# Patient Record
Sex: Female | Born: 1985 | Hispanic: No | Marital: Single | Smoking: Never smoker
Health system: Southern US, Community
[De-identification: ages and names within clinical notes are randomized; demographics above are authoritative.]

## PROBLEM LIST (undated history)

## (undated) DIAGNOSIS — G4730 Sleep apnea, unspecified: Secondary | ICD-10-CM

## (undated) DIAGNOSIS — K219 Gastro-esophageal reflux disease without esophagitis: Secondary | ICD-10-CM

## (undated) DIAGNOSIS — E538 Deficiency of other specified B group vitamins: Secondary | ICD-10-CM

## (undated) DIAGNOSIS — G7000 Myasthenia gravis without (acute) exacerbation: Secondary | ICD-10-CM

## (undated) DIAGNOSIS — F32A Depression, unspecified: Secondary | ICD-10-CM

## (undated) DIAGNOSIS — I519 Heart disease, unspecified: Secondary | ICD-10-CM

## (undated) DIAGNOSIS — S0300XA Dislocation of jaw, unspecified side, initial encounter: Secondary | ICD-10-CM

## (undated) DIAGNOSIS — M359 Systemic involvement of connective tissue, unspecified: Secondary | ICD-10-CM

## (undated) DIAGNOSIS — G43909 Migraine, unspecified, not intractable, without status migrainosus: Secondary | ICD-10-CM

## (undated) DIAGNOSIS — J9600 Acute respiratory failure, unspecified whether with hypoxia or hypercapnia: Secondary | ICD-10-CM

## (undated) DIAGNOSIS — T7840XA Allergy, unspecified, initial encounter: Secondary | ICD-10-CM

## (undated) DIAGNOSIS — R7303 Prediabetes: Secondary | ICD-10-CM

## (undated) DIAGNOSIS — M797 Fibromyalgia: Secondary | ICD-10-CM

## (undated) DIAGNOSIS — I10 Essential (primary) hypertension: Secondary | ICD-10-CM

## (undated) DIAGNOSIS — R12 Heartburn: Secondary | ICD-10-CM

## (undated) DIAGNOSIS — F329 Major depressive disorder, single episode, unspecified: Secondary | ICD-10-CM

## (undated) DIAGNOSIS — G935 Compression of brain: Secondary | ICD-10-CM

## (undated) DIAGNOSIS — J309 Allergic rhinitis, unspecified: Secondary | ICD-10-CM

## (undated) DIAGNOSIS — I509 Heart failure, unspecified: Secondary | ICD-10-CM

## (undated) DIAGNOSIS — I272 Pulmonary hypertension, unspecified: Secondary | ICD-10-CM

## (undated) DIAGNOSIS — Z91018 Allergy to other foods: Secondary | ICD-10-CM

## (undated) DIAGNOSIS — J45909 Unspecified asthma, uncomplicated: Secondary | ICD-10-CM

## (undated) HISTORY — DX: Sleep apnea, unspecified: G47.30

## (undated) HISTORY — DX: Essential (primary) hypertension: I10

## (undated) HISTORY — DX: Pulmonary hypertension, unspecified: I27.20

## (undated) HISTORY — DX: Depression, unspecified: F32.A

## (undated) HISTORY — DX: Migraine, unspecified, not intractable, without status migrainosus: G43.909

## (undated) HISTORY — DX: Heart failure, unspecified: I50.9

## (undated) HISTORY — DX: Allergy, unspecified, initial encounter: T78.40XA

## (undated) HISTORY — DX: Unspecified asthma, uncomplicated: J45.909

## (undated) HISTORY — DX: Heart disease, unspecified: I51.9

## (undated) HISTORY — DX: Gastro-esophageal reflux disease without esophagitis: K21.9

## (undated) HISTORY — DX: Compression of brain: G93.5

## (undated) HISTORY — DX: Prediabetes: R73.03

## (undated) HISTORY — DX: Allergic rhinitis, unspecified: J30.9

## (undated) HISTORY — DX: Fibromyalgia: M79.7

## (undated) HISTORY — DX: Dislocation of jaw, unspecified side, initial encounter: S03.00XA

## (undated) HISTORY — DX: Allergy to other foods: Z91.018

## (undated) HISTORY — DX: Myasthenia gravis without (acute) exacerbation: G70.00

## (undated) HISTORY — PX: CARDIAC CATHETERIZATION: SHX172

## (undated) HISTORY — DX: Systemic involvement of connective tissue, unspecified: M35.9

## (undated) HISTORY — PX: THYMECTOMY: SHX1063

## (undated) HISTORY — DX: Heartburn: R12

## (undated) HISTORY — DX: Deficiency of other specified B group vitamins: E53.8

## (undated) HISTORY — DX: Acute respiratory failure, unspecified whether with hypoxia or hypercapnia: J96.00

---
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
## 2007-04-10 HISTORY — PX: THYMECTOMY: SHX1063

## 2007-04-23 ENCOUNTER — Ambulatory Visit (HOSPITAL_COMMUNITY): Admission: RE | Admit: 2007-04-23 | Discharge: 2007-04-23 | Payer: Self-pay | Admitting: Gastroenterology

## 2007-06-25 ENCOUNTER — Encounter: Admission: RE | Admit: 2007-06-25 | Discharge: 2007-06-25 | Payer: Self-pay | Admitting: Neurology

## 2008-08-23 ENCOUNTER — Ambulatory Visit: Payer: Self-pay | Admitting: Gynecology

## 2008-08-23 ENCOUNTER — Encounter: Payer: Self-pay | Admitting: Gynecology

## 2008-08-23 ENCOUNTER — Other Ambulatory Visit: Admission: RE | Admit: 2008-08-23 | Discharge: 2008-08-23 | Payer: Self-pay | Admitting: Gynecology

## 2008-11-07 ENCOUNTER — Emergency Department (HOSPITAL_COMMUNITY): Admission: EM | Admit: 2008-11-07 | Discharge: 2008-11-07 | Payer: Self-pay | Admitting: Emergency Medicine

## 2010-07-16 LAB — DIFFERENTIAL
Lymphs Abs: 2.8 10*3/uL (ref 0.7–4.0)
Monocytes Relative: 7 % (ref 3–12)
Neutro Abs: 5.8 10*3/uL (ref 1.7–7.7)
Neutrophils Relative %: 60 % (ref 43–77)

## 2010-07-16 LAB — BASIC METABOLIC PANEL
CO2: 30 mEq/L (ref 19–32)
Calcium: 9.2 mg/dL (ref 8.4–10.5)
Chloride: 102 mEq/L (ref 96–112)
GFR calc Af Amer: >60 mL/min (ref >60)
Sodium: 139 mEq/L (ref 135–145)

## 2010-07-16 LAB — URINALYSIS, ROUTINE W REFLEX MICROSCOPIC
Nitrite: NEGATIVE
Specific Gravity, Urine: 1.011 (ref 1.005–1.030)
pH: 6.5 (ref 5.0–8.0)

## 2010-07-16 LAB — CBC
RBC: 4.31 MIL/uL (ref 3.87–5.11)
WBC: 9.7 10*3/uL (ref 4.0–10.5)

## 2010-07-16 LAB — MONONUCLEOSIS SCREEN: Mono Screen: NEGATIVE

## 2010-07-16 LAB — RAPID STREP SCREEN (MED CTR MEBANE ONLY): Streptococcus, Group A Screen (Direct): NEGATIVE

## 2010-07-16 LAB — POCT PREGNANCY, URINE: Preg Test, Ur: NEGATIVE

## 2010-07-16 LAB — CK: Total CK: 70 U/L (ref 7–177)

## 2011-06-19 DIAGNOSIS — G7000 Myasthenia gravis without (acute) exacerbation: Secondary | ICD-10-CM | POA: Insufficient documentation

## 2012-08-06 ENCOUNTER — Encounter: Payer: Self-pay | Admitting: Gynecology

## 2012-08-06 ENCOUNTER — Ambulatory Visit (INDEPENDENT_AMBULATORY_CARE_PROVIDER_SITE_OTHER): Payer: BC Managed Care – PPO | Admitting: Gynecology

## 2012-08-06 VITALS — BP 118/74 | Ht 66.0 in | Wt 180.0 lb

## 2012-08-06 DIAGNOSIS — N9089 Other specified noninflammatory disorders of vulva and perineum: Secondary | ICD-10-CM

## 2012-08-06 DIAGNOSIS — N760 Acute vaginitis: Secondary | ICD-10-CM

## 2012-08-06 DIAGNOSIS — A499 Bacterial infection, unspecified: Secondary | ICD-10-CM

## 2012-08-06 DIAGNOSIS — B9689 Other specified bacterial agents as the cause of diseases classified elsewhere: Secondary | ICD-10-CM

## 2012-08-06 LAB — URINALYSIS W MICROSCOPIC + REFLEX CULTURE
Glucose, UA: NEGATIVE mg/dL
Hgb urine dipstick: NEGATIVE
Ketones, ur: NEGATIVE mg/dL
Leukocytes, UA: NEGATIVE
Protein, ur: NEGATIVE mg/dL

## 2012-08-06 LAB — WET PREP FOR TRICH, YEAST, CLUE
Trich, Wet Prep: NONE SEEN
Yeast Wet Prep HPF POC: NONE SEEN

## 2012-08-06 MED ORDER — METRONIDAZOLE 500 MG PO TABS: 500.0000 mg | ORAL_TABLET | Freq: Two times a day (BID) | ORAL | Status: DC

## 2012-08-06 NOTE — Progress Notes (Signed)
Patient presents with one-week history of vaginal irritation and slight discharge. Has been wearing tight underclothes but no other precipitating event. No odor. No urinary symptoms. Has recently increased her prednisone dose.  Exam with Selena Batten Assistant Abdomen soft nontender without masses guarding rebound organomegaly Pelvic external BUS vagina with white discharge. Cervix normal. Uterus normal size midline mobile nontender. Adnexa without masses or tenderness.   Assessment and plan: history, exam and wet prep consistent with bacterial vaginosis. Treat with Flagyl 500 mg twice a day x7 days at her choice, alcohol avoidance reviewed. Followup if symptoms persist, worsen or recur. Patient will schedule annual exam as she is overdue in May.

## 2012-08-06 NOTE — Patient Instructions (Signed)
Take Flagyl medication twice daily for 7 days, avoid alcohol while taking. Followup if your symptoms persist, worsen or recur. Followup for your annual exam as scheduled.

## 2012-08-20 ENCOUNTER — Encounter: Payer: Self-pay | Admitting: Gynecology

## 2012-09-02 ENCOUNTER — Encounter: Payer: Self-pay | Admitting: Gynecology

## 2012-09-09 ENCOUNTER — Encounter: Payer: Self-pay | Admitting: Gynecology

## 2012-11-13 ENCOUNTER — Other Ambulatory Visit (HOSPITAL_COMMUNITY)
Admission: RE | Admit: 2012-11-13 | Discharge: 2012-11-13 | Disposition: A | Discharge status: Home / Self Care | Payer: BC Managed Care – PPO | Source: Ambulatory Visit | Attending: Gynecology | Admitting: Gynecology

## 2012-11-13 ENCOUNTER — Encounter: Payer: Self-pay | Admitting: Gynecology

## 2012-11-13 ENCOUNTER — Ambulatory Visit (INDEPENDENT_AMBULATORY_CARE_PROVIDER_SITE_OTHER): Payer: BC Managed Care – PPO | Admitting: Gynecology

## 2012-11-13 VITALS — BP 120/78 | Ht 66.5 in | Wt 172.0 lb

## 2012-11-13 DIAGNOSIS — Z01419 Encounter for gynecological examination (general) (routine) without abnormal findings: Secondary | ICD-10-CM

## 2012-11-13 DIAGNOSIS — N760 Acute vaginitis: Secondary | ICD-10-CM

## 2012-11-13 DIAGNOSIS — B9689 Other specified bacterial agents as the cause of diseases classified elsewhere: Secondary | ICD-10-CM

## 2012-11-13 DIAGNOSIS — A499 Bacterial infection, unspecified: Secondary | ICD-10-CM

## 2012-11-13 LAB — WET PREP FOR TRICH, YEAST, CLUE

## 2012-11-13 MED ORDER — METRONIDAZOLE 500 MG PO TABS: 500.0000 mg | ORAL_TABLET | Freq: Two times a day (BID) | ORAL | Status: DC

## 2012-11-13 NOTE — Patient Instructions (Signed)
Follow up for annual exam in one year 

## 2012-11-13 NOTE — Addendum Note (Signed)
Addended by: Dayna Barker on: 11/13/2012 04:17 PM   Modules accepted: Orders

## 2012-11-13 NOTE — Progress Notes (Signed)
Sandy Baker 21-May-1985 [id number]        27 y.o.  G0P0 for annual exam.  Doing well without complaints.  Past medical history,surgical history, medications, allergies, family history and social history were all reviewed and documented in the EPIC chart.  ROS:  Performed and pertinent positives and negatives are included in the history, assessment and plan .  Exam: [name] Filed Vitals:   11/13/12 1544  BP: 120/78  Height: 5' 6.5" (1.689 m)  Weight: 172 lb (78.019 kg)   General appearance  Normal Skin grossly normal Head/Neck normal with no cervical or supraclavicular adenopathy thyroid normal Lungs  clear Cardiac RR, without RMG Abdominal  soft, nontender, without masses, organomegaly or hernia Breasts  examined lying and sitting without masses, retractions, discharge or axillary adenopathy. Pelvic  Ext/BUS/vagina  normal with white discharge  Cervix  normal Pap done  Uterus  anteverted, normal size, shape and contour, midline and mobile nontender   Adnexa  Without masses or tenderness    Anus and perineum  normal       Assessment/Plan:  27 y.o. G0P0 female for annual exam, regular menses, condom contraception.   1. Birth control. I reviewed birth control options with her. She is occasionally sexually active. Reviewed still risk for pregnancy and failure risk with condoms. Patient is not interested in alternatives and understands accepts risk. Availability of Plan B also discussed. 2. White discharge.  Patient is asymptomatic but her wet prep does suggest early bacterial vaginosis. She was treated in April for symptomatic bacterial vaginosis. Going cover her with Flagyl 500 mg twice a day x7 days alcohol avoidance reviewed. 3. STD screening offered and declined. 4. Gardasil series received. 5. Breast self. SBE monthly reviewed. 6. Pap smear done today. No history of abnormal Pap smears previously. If normal plan three-year repeat. 7. Health maintenance. No lab work  done as it is all done through her other physician's office. Followup one year, sooner as needed.  Note: This document was prepared with digital dictation and possible smart phrase technology. Any transcriptional errors that result from this process are unintentional.   [name], 4:08 PM 11/13/2012

## 2012-11-13 NOTE — Addendum Note (Signed)
Addended by: Dayna Barker on: 11/13/2012 04:36 PM   Modules accepted: Orders

## 2012-11-17 ENCOUNTER — Other Ambulatory Visit: Payer: Self-pay | Admitting: Gynecology

## 2012-11-18 ENCOUNTER — Other Ambulatory Visit: Payer: Self-pay | Admitting: Gynecology

## 2012-11-18 MED ORDER — FLUCONAZOLE 150 MG PO TABS: 150.0000 mg | ORAL_TABLET | Freq: Once | ORAL | Status: DC

## 2013-09-09 DIAGNOSIS — J45909 Unspecified asthma, uncomplicated: Secondary | ICD-10-CM | POA: Insufficient documentation

## 2013-09-09 DIAGNOSIS — J309 Allergic rhinitis, unspecified: Secondary | ICD-10-CM | POA: Insufficient documentation

## 2013-09-09 DIAGNOSIS — M797 Fibromyalgia: Secondary | ICD-10-CM | POA: Insufficient documentation

## 2013-09-12 DIAGNOSIS — K219 Gastro-esophageal reflux disease without esophagitis: Secondary | ICD-10-CM | POA: Insufficient documentation

## 2014-07-19 ENCOUNTER — Ambulatory Visit (INDEPENDENT_AMBULATORY_CARE_PROVIDER_SITE_OTHER): Payer: 59 | Admitting: Family Medicine

## 2014-07-19 VITALS — BP 104/76 | HR 111 | Temp 98.2°F | Resp 20 | Ht 67.0 in | Wt 194.0 lb

## 2014-07-19 DIAGNOSIS — J302 Other seasonal allergic rhinitis: Secondary | ICD-10-CM | POA: Diagnosis not present

## 2014-07-19 DIAGNOSIS — M797 Fibromyalgia: Secondary | ICD-10-CM

## 2014-07-19 DIAGNOSIS — G43809 Other migraine, not intractable, without status migrainosus: Secondary | ICD-10-CM

## 2014-07-19 DIAGNOSIS — G4730 Sleep apnea, unspecified: Secondary | ICD-10-CM | POA: Diagnosis not present

## 2014-07-19 DIAGNOSIS — G7000 Myasthenia gravis without (acute) exacerbation: Secondary | ICD-10-CM

## 2014-07-19 NOTE — Progress Notes (Addendum)
Subjective:    Patient ID: Sandy Baker, female    DOB: 06/04/1985, 29 y.o.   MRN: [id number]  HPI Chief Complaint  Patient presents with  . Breathing Problem    while sleeping  . Referral    migraines and fibromyalgia   This chart was scribed for Norberto Sorenson, MD by Andrew Au, ED Scribe. This patient was seen in room 13 and the patient's care was started at 5:52 PM.  HPI Comments: Sandy Baker is a 29 y.o. female who presents to the [facility] complaining of trouble breathing while sleeping that began 1 month ago. Pt states she quits breathing and chokes while sleeping and is constantly waking during the night. Pt is unsure if symptoms occur every night but reports symptoms occurred 4 times last night and usually occur when drifting to sleep.  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 Pt states she is seen by neurologist Dr. Orson Gear at [facility] and has not been able to get an appointment and no return call after repeatedly trying 1-2 weeks ago.  Pt was told that apnea can often by connected to MG and that she needs a sleep study but that she has not been contacted to schedule one and that she is getting VERY fatigued as she is terrified to go to sleep - she worries that she is going to choke and die in her sleep - which no one has been able to reassure her will not happen due to her complication of MG.  She has been trying to sleep on her side supported by pillows to help her throat and soft palate fall open but not much success as she moves to much during sleep.  Pt states she has been told during allergy season symptoms flare. She reports postnasal drip. Pt states she uses a neb treatment at home in the morning and at night to help with allergies.  Pt is currently taking Singulair, Xyzal  and tessalon.   She denies congestion.   Pt is requesting a referral to Dr. Orson Gear, phone number [phone], Joycie Peek at the pain clinic, and [facility] rheumatologist at internal med.   Pt would like to establish here for a PCP.  Her insurance changed and so pt needs new referrals from a PCP to her current team of specialists at [facility] for the visits to be covered.  Aanvi's sister, Essence, is applying to medical school and frequently works with [place] as a Neurosurgeon.  Past Medical History  Diagnosis Date  . Autoimmune disease   . Asthma   . Myasthenia gravis   . Migraines   . TMJ (dislocation of temporomandibular joint)    Past Surgical History  Procedure Laterality Date  . Thymectomy     Prior to Admission medications   Medication Sig Start Date End Date Taking? Authorizing Provider  ALBUTEROL IN Inhale into the lungs.   Yes Historical Provider, MD  butalbital-aspirin-caffeine [facility]) 50-325-40 MG per tablet Take 1 tablet by mouth 2 (two) times daily as needed for headache.   Yes Historical Provider, MD  fluconazole (DIFLUCAN) 150 MG tablet Take 1 tablet (150 mg total) by mouth once. 11/18/12  Yes Dara Lords, MD  metroNIDAZOLE (FLAGYL) 500 MG tablet Take 1 tablet (500 mg total) by mouth  2 (two) times daily. For 7 days.  Avoid alcohol while taking 11/13/12  Yes Dara Lords, MD  mycophenolate (CELLCEPT) 500 MG tablet Take by mouth 2 (two) times daily.   Yes Historical Provider, MD  nortriptyline (PAMELOR) 10 MG capsule Take 10 mg by mouth at bedtime.   Yes Historical Provider, MD  predniSONE (DELTASONE) 10 MG tablet Take 10 mg by mouth daily.   Yes Historical Provider, MD  Topiramate (TOPAMAX PO) Take by mouth.   Yes Historical Provider, MD    Review of Systems  Constitutional: Positive for activity change, fatigue and unexpected weight change. Negative for fever, chills and appetite change.  HENT: Positive for congestion, postnasal drip and rhinorrhea. Negative for ear  pain, facial swelling, mouth sores, sore throat and trouble swallowing.   Respiratory: Positive for apnea and choking. Negative for cough and wheezing.   Cardiovascular: Negative for chest pain and palpitations.  Gastrointestinal: Negative for vomiting.  Musculoskeletal: Positive for arthralgias and gait problem. Negative for myalgias, back pain and joint swelling.  Skin: Negative for color change and rash.  Neurological: Positive for speech difficulty, weakness and headaches. Negative for syncope.  Psychiatric/Behavioral: Positive for sleep disturbance. Negative for dysphoric mood. The patient is nervous/anxious.     Objective:   Physical Exam  Constitutional: She is oriented to person, place, and time. She appears well-developed and well-nourished. No distress.  HENT:  Head: Normocephalic and atraumatic.  Right Ear: Tympanic membrane, external ear and ear canal normal.  Left Ear: Tympanic membrane, external ear and ear canal normal.  Nose: Mucosal edema present.  Mouth/Throat: Posterior oropharyngeal edema and posterior oropharyngeal erythema present.  Eyes: Conjunctivae and EOM are normal. No scleral icterus.  Neck: Normal range of motion. Neck supple. No thyromegaly present.  Cardiovascular: Normal rate, regular rhythm, normal heart sounds and intact distal pulses.   Pulmonary/Chest: Effort normal and breath sounds normal. No respiratory distress.  Musculoskeletal: Normal range of motion. She exhibits no edema.  Lymphadenopathy:    She has no cervical adenopathy.  Neurological: She is alert and oriented to person, place, and time.  Skin: Skin is warm and dry. She is not diaphoretic. No erythema.  Psychiatric: She has a normal mood and affect. Her behavior is normal.  Nursing note and vitals reviewed.  Filed Vitals:   07/19/14 1731  BP: 104/76  Pulse: 111  Temp: 98.2 F (36.8 C)  Resp: 20    Assessment & Plan:   Sleep apnea - Plan: Ambulatory referral to Sleep Studies -  needs sleep study asap - unfortunately it is taking [facility] sev mos to work new pts in just to the clinic for consultation which is time that pt cannot afford - I am  Hoping that since she was seen there in 2009 and since this is clearly such an atypical and complicated case with obvious concerns for much higher acuity that they can work pt in sooner or at least give pt (and me) some guidance as to natural history and progression of apneic episodes/hypoventilation in MG pts.   If [facility] is unable to see pt soon will need to request for [facility], [facility], or other - at least need neurology eval and comment on the acuity of this.  Myasthenia gravis - Plan: Ambulatory referral to Sleep Studies, Ambulatory referral to Neurology, Ambulatory referral to Pain Clinic, Ambulatory referral to Rheumatology  Seasonal allergies - cont singulair, xyzal, and prn tessalon  Ocular myasthenia gravis - Plan: Ambulatory referral to Sleep Studies,  Ambulatory referral to Neurology, Ambulatory referral to Pain Clinic, Ambulatory referral to Rheumatology  Other migraine without status migrainosus, not intractable - Plan: Ambulatory referral to Neurology, Ambulatory referral to Pain Clinic  Fibromyalgia - Plan: Ambulatory referral to Pain Clinic, Ambulatory referral to Rheumatology All referrals to [facility] need to be updated for insurance purposes.   Meds ordered this encounter  Medications  . butalbital-aspirin-caffeine (FIORINAL) 50-325-40 MG per tablet    Sig: Take 1 tablet by mouth 2 (two) times daily as needed for headache.    I personally performed the services described in this documentation, which was scribed in my presence. The recorded information has been reviewed and considered, and addended by me as needed.  Norberto SorensonEva Kataya Guimont, MD MPH

## 2014-08-27 ENCOUNTER — Encounter: Payer: Self-pay | Admitting: Neurology

## 2014-08-27 ENCOUNTER — Ambulatory Visit (INDEPENDENT_AMBULATORY_CARE_PROVIDER_SITE_OTHER): Payer: 59 | Admitting: Neurology

## 2014-08-27 VITALS — BP 128/80 | HR 92 | Resp 16 | Ht 67.0 in | Wt 198.0 lb

## 2014-08-27 DIAGNOSIS — G7000 Myasthenia gravis without (acute) exacerbation: Secondary | ICD-10-CM | POA: Diagnosis not present

## 2014-08-27 DIAGNOSIS — R519 Headache, unspecified: Secondary | ICD-10-CM

## 2014-08-27 DIAGNOSIS — G2581 Restless legs syndrome: Secondary | ICD-10-CM | POA: Diagnosis not present

## 2014-08-27 DIAGNOSIS — R51 Headache: Secondary | ICD-10-CM | POA: Diagnosis not present

## 2014-08-27 DIAGNOSIS — G4710 Hypersomnia, unspecified: Secondary | ICD-10-CM | POA: Diagnosis not present

## 2014-08-27 DIAGNOSIS — G478 Other sleep disorders: Secondary | ICD-10-CM

## 2014-08-27 DIAGNOSIS — R0683 Snoring: Secondary | ICD-10-CM | POA: Diagnosis not present

## 2014-08-27 DIAGNOSIS — R400 Somnolence: Secondary | ICD-10-CM

## 2014-08-27 NOTE — Patient Instructions (Signed)

## 2014-08-27 NOTE — Progress Notes (Signed)
Subjective:    Patient ID: Sandy Baker is a 29 y.o. female.  HPI      Dear Dr. Clelia CroftShaw,   I saw your patient, Sandy Baker, upon your kind request in my neurologic clinic today for initial consultation of her sleep disorder, in particular, concern for underlying obstructive sleep apnea. The patient is unaccompanied today. As you know, Ms. Sandy Baker is a 29 year old right-handed woman with an underlying medical history of asthma, migraines, ocular myasthenia (diagnosed in 2009, status post thymectomy in 2009), for which [facility] is [facility], who reports snoring and excessive daytime somnolence. She has woken herself up with a gasp or panic or startle at times. She has occasional morning headaches. She has had trouble with going to sleep and staying asleep. Her gasping issues at night started about 6 months ago. There is a family history of insomnia in her mother, and maternal aunts, her sister has essential tremor and a maternal uncle has ET too.  She has occasional RLS symptoms, but is not sure if she kicks in her sleep. She lives with her parents and sleeps alone, snoring is mild and not every night as far as she knows.  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 She would like to go to PA school. She has no nocturia. She may take 0-3 hours to fall asleep. Her Epworth sleepiness score is 6 out of 24, her fatigue score is 42 out of 63 today. She drinks Dr. Reino KentPepper, 1-2 per week, no caffeine daily. She is a non-smoker and does not drink alcohol. Her main complaint regarding her sleep is nonrestorative sleep, difficulty initiating and maintaining sleep, and waking up with a sense of gasping for air. She has [facility] by [facility] to [facility] a sleep study because of her underlying  neuromuscular disease.  Her Past Medical History Is Significant For: Past Medical History  Diagnosis Date  . Autoimmune disease   . Asthma   . Myasthenia gravis   . Migraines   . TMJ (dislocation of temporomandibular joint)   . Fibromyalgia     Her Past Surgical History Is Significant For: Past Surgical History  Procedure Laterality Date  . Thymectomy      Her Family History Is Significant For: Family History  Problem Relation Age of Onset  . Hypertension Mother   . Depression Mother   . Asthma Mother   . Bipolar disorder Mother   . Asthma Sister     Her Social History Is Significant For: History   Social History  . Marital Status: Single    Spouse Name: Sandy Baker  . Number of Children: Sandy Baker  . Years of Education: BA   Occupational History  . [employer]    Social History Main Topics  . Smoking status: Never Smoker   . Smokeless tobacco: Not on file  . Alcohol Use: No     Comment: Rare  . Drug Use: No  . Sexual Activity: Yes    Birth Control/ Protection: Condom   Other Topics Concern  . 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 Reino KentPepper    Her Allergies Are:  Allergies  Allergen Reactions  . Amoxicillin   .  Botox [Botulinum Toxin Type A]   . Iodine   . Latex   . Penicillins   . Quinine Derivatives   . Relpax [Eletriptan]   . Saline   . Zithromax [Azithromycin]   :   Her Current Medications Are:  Outpatient Encounter Prescriptions as of 08/27/2014  Medication Sig  . ALBUTEROL IN Inhale into the lungs.  . benzonatate (TESSALON) 100 MG capsule Take 100 mg by mouth 3 (three) times daily as needed.  . butalbital-aspirin-caffeine (FIORINAL) 50-325-40 MG per tablet Take 1 tablet by mouth 2 (two) times daily as needed for headache.  . levocetirizine (XYZAL) 5 MG tablet   . montelukast (SINGULAIR) 10 MG tablet   . mycophenolate (CELLCEPT) 250 MG capsule   . nortriptyline (PAMELOR) 10 MG capsule Take 10 mg by mouth at  bedtime.  . predniSONE (DELTASONE) 10 MG tablet Take 10 mg by mouth daily.  . Topiramate (TOPAMAX PO) Take by mouth.  . topiramate (TOPAMAX) 50 MG tablet   . [DISCONTINUED] fluconazole (DIFLUCAN) 150 MG tablet Take 1 tablet (150 mg total) by mouth once.  . [DISCONTINUED] metroNIDAZOLE (FLAGYL) 500 MG tablet Take 1 tablet (500 mg total) by mouth 2 (two) times daily. For 7 days.  Avoid alcohol while taking  . [DISCONTINUED] mycophenolate (CELLCEPT) 500 MG tablet Take by mouth 2 (two) times daily.   No facility-administered encounter medications on file as of 08/27/2014.  :  Review of Systems:  Out of a complete 14 point review of systems, all are reviewed and negative with the exception of these symptoms as listed below:   Review of Systems  Constitutional:       Weight gain   Eyes: Positive for pain.  Cardiovascular: Positive for leg swelling.  Neurological:       Insomnia    Objective:  Neurologic Exam  Physical Exam Physical Examination:   Filed Vitals:   08/27/14 0844  BP: 128/80  Pulse: 92  Resp: 16    General Examination: The patient is a very pleasant 29 y.o. female in no acute distress. She appears well-developed and well-nourished and well groomed.   HEENT: Normocephalic, atraumatic, pupils are equal, round and reactive to light and accommodation. Funduscopic exam is normal with sharp disc margins noted. Extraocular tracking is good without limitation to gaze excursion or nystagmus noted. Normal smooth pursuit is noted. Hearing is grossly intact. Tympanic membranes are clear bilaterally. Face is symmetric with  very subtle facial weakness noted and mild bilateral ptosis noted. Speech is clear but slightly nasal with no dysarthria noted. There is no hypophonia. There is no lip, neck/head, jaw or voice tremor. Neck is supple with full range of passive and active motion. There are no carotid bruits on auscultation. Oropharynx exam reveals: mild mouth dryness, good dental  hygiene and mild airway crowding, due to narrow airway entry, larger uvula and elongated tongue. Mallampati is class II. Tongue protrudes centrally and palate elevates symmetrically. Tonsils are small. Neck size is 14.25 inches. She has a Mild overbite. Nasal inspection reveals no significant nasal mucosal bogginess or redness and no septal deviation.   Chest: Clear to auscultation without wheezing, rhonchi or crackles noted. She has a unremarkable midsternal scar from her thymectomy in 2009 (mild keloid formation).  Heart: S1+S2+0, regular and normal without murmurs, rubs or gallops noted.   Abdomen: Soft, non-tender and non-distended with normal bowel sounds appreciated on auscultation.  Extremities: There is no pitting edema in the distal lower extremities bilaterally. Pedal pulses are intact.  Skin: Warm and dry without trophic changes noted. There are no varicose veins.  Musculoskeletal: exam reveals no obvious joint deformities, tenderness or joint swelling or erythema.   Neurologically:  Mental status: The patient is awake, alert and oriented in all 4 spheres. Her immediate and remote memory, attention, language skills and fund of knowledge are appropriate. There is no evidence of aphasia, agnosia, apraxia or anomia. Speech is clear with normal prosody and enunciation. Thought process is linear. Mood is normal and affect is normal.  Cranial nerves II - XII are as described above under HEENT exam. In addition: shoulder shrug is normal with equal shoulder height noted. Motor exam: Normal bulk, strength and tone is noted. There is no drift, tremor or rebound. Romberg is negative. Reflexes are 2+ throughout. Babinski: Toes are flexor bilaterally. Fine motor skills and coordination: intact with normal finger taps, normal hand movements, normal rapid alternating patting, normal foot taps and normal foot agility.  Cerebellar testing: No dysmetria or intention tremor on finger to nose testing. Heel  to shin is unremarkable bilaterally. There is no truncal or gait ataxia.  Sensory exam: intact to light touch, pinprick, vibration, temperature sense in the upper and lower extremities.  Gait, station and balance: She stands easily. No veering to one side is noted. No leaning to one side is noted. Posture is age-appropriate and stance is narrow based. Gait shows normal stride length and normal pace. No problems turning are noted. She turns en bloc. Tandem walk is unremarkable. Intact toe and heel stance is noted.               Assessment and Plan:   In summary, Sandy Baker is a very pleasant 29 y.o.-year old female with an underlying medical history of asthma, migraines, ocular myasthenia (diagnosed in 2009, status post thymectomy in 2009), for which [facility] is [facility], whose history and physical exam are somewhat concerning for obstructive sleep apnea (OSA). Having myasthenia gravis does put her in a higher risk group for sleep disordered breathing.  I had a long chat with the patient about my findings and the diagnosis of OSA, its prognosis and treatment options. We talked about medical treatments, surgical interventions and non-pharmacological approaches. I explained in particular the risks and ramifications of untreated moderate to severe OSA, especially with respect to developing cardiovascular disease down the Road, including congestive heart failure, difficult to treat hypertension, cardiac arrhythmias, or stroke. Even type 2 diabetes has, in part, been linked to untreated OSA. Symptoms of untreated OSA include daytime sleepiness, memory problems, mood irritability and mood disorder such as depression and anxiety, lack of energy, as well as recurrent headaches, especially morning headaches. We talked about trying to maintain a healthy lifestyle in general, as well as the importance of weight control. I encouraged the patient to eat healthy, exercise daily and keep well hydrated, to keep a  scheduled bedtime and wake time routine, to not skip any meals and eat healthy snacks in between meals. I advised the patient not to drive when feeling sleepy. I recommended the following at this time: sleep study with potential positive airway pressure titration. (We will score hypopneas at 4% and split the sleep study into diagnostic and treatment portion, if the estimated. 2 hour AHI is >20/h).   I explained the sleep test procedure to the patient and also outlined possible surgical and non-surgical treatment options of OSA, including the use of a custom-made dental device (which would require a referral to a  specialist dentist or oral surgeon), upper airway surgical options, such as pillar implants, radiofrequency surgery, tongue base surgery, and UPPP (which would involve a referral to an ENT surgeon). Rarely, jaw surgery such as mandibular advancement may be considered. However, her MG does not make her a good surgical candidate in general. I also explained the CPAP treatment option to the patient, who indicated that she would be willing to try CPAP if the need arises. I explained the importance of being compliant with PAP treatment, not only for insurance purposes but primarily to improve Her symptoms, and for the patient's long term health benefit, including to reduce Her cardiovascular risks. I answered all her questions today and the patient was in agreement. I would like to see her back after the sleep study is completed and encouraged her to call with any interim questions, concerns, problems or updates.   Thank you very much for allowing me to participate in the care of this nice patient. If I can be of any further assistance to you please do not hesitate to call me at [phone].  Sincerely,   Huston Foley, MD, PhD

## 2014-09-17 ENCOUNTER — Telehealth: Payer: Self-pay | Admitting: Neurology

## 2014-09-17 DIAGNOSIS — R0683 Snoring: Secondary | ICD-10-CM

## 2014-09-17 DIAGNOSIS — G4719 Other hypersomnia: Secondary | ICD-10-CM

## 2014-09-17 DIAGNOSIS — G478 Other sleep disorders: Secondary | ICD-10-CM

## 2014-09-17 NOTE — Telephone Encounter (Signed)
[employer] contacted the office to inform that request for split study has been denied. For Peer to peer review to further process please call [phone]. Home sleep test has been advised.

## 2014-10-20 ENCOUNTER — Telehealth: Payer: Self-pay | Admitting: Neurology

## 2014-10-20 NOTE — Telephone Encounter (Signed)
PSG denied, need order for HST °

## 2014-10-28 ENCOUNTER — Telehealth: Payer: Self-pay | Admitting: Neurology

## 2014-10-28 NOTE — Telephone Encounter (Signed)
Pt called and says that she has an appt for a sleep study in [place], insurance will not pay for it. They will only pay for a home sleep study , she needs to know what to do. Her appointment is 7/22 at 9pm. Please call and advise [phone]

## 2014-11-05 DIAGNOSIS — G4733 Obstructive sleep apnea (adult) (pediatric): Secondary | ICD-10-CM | POA: Insufficient documentation

## 2014-11-10 ENCOUNTER — Telehealth: Payer: Self-pay | Admitting: Neurology

## 2014-11-10 NOTE — Telephone Encounter (Signed)
Dr. Frances Furbish stated that she has received the report and signed off on it. (It has not been scanned into the chart yet) Dr. Frances Furbish states that the report says that their doctors will treat her.

## 2014-11-10 NOTE — Telephone Encounter (Signed)
This patient's insurance denied an attended sleep study. I will order home sleep test.  

## 2014-11-10 NOTE — Telephone Encounter (Signed)
I called Ms. Riggins to see if she had a sleep study in the recent months and she reports the sleep study was completed at [facility].  She would like for Dr. Frances Furbish to get the results of this sleep study for her.

## 2014-12-22 ENCOUNTER — Ambulatory Visit (INDEPENDENT_AMBULATORY_CARE_PROVIDER_SITE_OTHER): Payer: 59 | Admitting: Family Medicine

## 2014-12-22 ENCOUNTER — Ambulatory Visit (INDEPENDENT_AMBULATORY_CARE_PROVIDER_SITE_OTHER): Payer: 59

## 2014-12-22 VITALS — BP 122/74 | HR 118 | Temp 98.8°F | Resp 20 | Ht 67.0 in | Wt 199.0 lb

## 2014-12-22 DIAGNOSIS — J4521 Mild intermittent asthma with (acute) exacerbation: Secondary | ICD-10-CM

## 2014-12-22 DIAGNOSIS — G7000 Myasthenia gravis without (acute) exacerbation: Secondary | ICD-10-CM

## 2014-12-22 DIAGNOSIS — R0602 Shortness of breath: Secondary | ICD-10-CM

## 2014-12-22 DIAGNOSIS — J0100 Acute maxillary sinusitis, unspecified: Secondary | ICD-10-CM | POA: Diagnosis not present

## 2014-12-22 DIAGNOSIS — D899 Disorder involving the immune mechanism, unspecified: Secondary | ICD-10-CM

## 2014-12-22 DIAGNOSIS — J301 Allergic rhinitis due to pollen: Secondary | ICD-10-CM | POA: Diagnosis not present

## 2014-12-22 DIAGNOSIS — R05 Cough: Secondary | ICD-10-CM | POA: Diagnosis not present

## 2014-12-22 DIAGNOSIS — D849 Immunodeficiency, unspecified: Secondary | ICD-10-CM

## 2014-12-22 LAB — POCT CBC
GRANULOCYTE PERCENT: 77.7 % (ref 37–80)
HEMATOCRIT: 43.6 % (ref 37.7–47.9)
HEMOGLOBIN: 12.9 g/dL (ref 12.2–16.2)
LYMPH, POC: 1.8 (ref 0.6–3.4)
MCH: 25.7 pg — AB (ref 27–31.2)
MCHC: 29.6 g/dL — AB (ref 31.8–35.4)
MCV: 87 fL (ref 80–97)
MID (cbc): 0.7 (ref 0–0.9)
MPV: 7.6 fL (ref 0–99.8)
POC GRANULOCYTE: 8.5 — AB (ref 2–6.9)
POC LYMPH PERCENT: 16.1 %L (ref 10–50)
POC MID %: 6.2 % (ref 0–12)
Platelet Count, POC: 327 10*3/uL (ref 142–424)
RBC: 5.01 M/uL (ref 4.04–5.48)
RDW, POC: 13.9 %
WBC: 10.9 10*3/uL — AB (ref 4.6–10.2)

## 2014-12-22 MED ORDER — MONTELUKAST SODIUM 10 MG PO TABS: 10.0000 mg | ORAL_TABLET | Freq: Every day | ORAL | Status: DC

## 2014-12-22 MED ORDER — BENZONATATE 100 MG PO CAPS
100.0000 mg | ORAL_CAPSULE | Freq: Three times a day (TID) | ORAL | Status: DC | PRN
Start: 2014-12-22 — End: 2016-03-27

## 2014-12-22 MED ORDER — LEVOCETIRIZINE DIHYDROCHLORIDE 5 MG PO TABS: 5.0000 mg | ORAL_TABLET | Freq: Every evening | ORAL | Status: DC

## 2014-12-22 MED ORDER — ALBUTEROL SULFATE (2.5 MG/3ML) 0.083% IN NEBU: 2.5000 mg | INHALATION_SOLUTION | Freq: Once | RESPIRATORY_TRACT | Status: DC

## 2014-12-22 MED ORDER — ALBUTEROL SULFATE (2.5 MG/3ML) 0.083% IN NEBU: 2.5000 mg | INHALATION_SOLUTION | Freq: Four times a day (QID) | RESPIRATORY_TRACT | Status: DC | PRN

## 2014-12-22 MED ORDER — TETRACYCLINE HCL 250 MG PO CAPS: 250.0000 mg | ORAL_CAPSULE | Freq: Four times a day (QID) | ORAL | Status: DC

## 2014-12-22 NOTE — Patient Instructions (Addendum)
1.  Purchase Flonase nasal spray and use 2 sprays in each nostril twice daily for one week and then once daily. 2.  Purchase Afrin nasal spray 2 sprays in each nostril twice daily for one week only and then STOP. 3. Take Prednisone  daily for 5 days then  daily for five days then  daily for five days then  daily afterwards.

## 2014-12-22 NOTE — Progress Notes (Addendum)
Subjective:  This chart was scribed for Nilda Simmer, MD by Andrew Au, ED Scribe. This patient was seen in room 9 and the patient's care was started at 7:52 PM.   Patient ID: Sandy Baker, female    DOB: Apr 12, 1985, 29 y.o.   MRN: [id number]  HPI Chief Complaint  Patient presents with  . Asthma    or allergies. Short of breath and cough. x1 week  . Medication Refill    albuterol   HPI Comments: Sandy Baker is a 29 y.o. Female with hx of myasthenia gravis who presents to the [facility] complaining of SOB and dry cough for the past week. She has associated teeth pain that began last night, HA, sinus pressure, sore throat, hoarseness, nasal congestion with thick green and yellow mucous and rhinorrhea. She has been taking tessalon perles and has been doing a nette rinse twice a day as well as taking allergy medications. She has hx of asthma, allergies,and myasthenia gravis. Pt states she ran out of albuterol nebulizer solution but has been using her pro air inhaler every 2-4 hours for the past 48 hours.   She usually uses nebulize once a week between the months of April- November which is when she has most asthma flares. Pt is also on BiPAP qhs. Takes prednisone  daily for myasthenia gravis and has been trying to wean off medication. Pt denies fever and chills.  Previous maintenance inhaler in the past; had an allergic reaction to it.  Has been using Proair inhaler every two hours for the past week.  Able to ambulate to work just fine; nasal congestion is horrible.    Goes to [facility] for neurology.  Has mus ocular MG.  S/p thymectomy.    There are no active problems to display for this patient.  Past Medical History  Diagnosis Date  . Autoimmune disease   . Asthma   . Myasthenia gravis   . Migraines   . TMJ (dislocation of temporomandibular joint)   . Fibromyalgia    Past Surgical History  Procedure Laterality Date  . Thymectomy     Allergies  Allergen  Reactions  . Amoxicillin   . Botox [Botulinum Toxin Type A]   . Iodine   . Latex   . Penicillins   . Quinine Derivatives   . Relpax [Eletriptan]   . Saline   . Zithromax [Azithromycin]     Prior to Admission medications   Medication Sig Start Date End Date Taking? Authorizing Provider  ALBUTEROL IN Inhale into the lungs.   Yes Historical Provider, MD  butalbital-aspirin-caffeine [facility]) 50-325-40 MG per tablet Take 1 tablet by mouth 2 (two) times daily as needed for headache.   Yes Historical Provider, MD  montelukast (SINGULAIR) 10 MG tablet  08/09/14  Yes Historical Provider, MD  mycophenolate (CELLCEPT) 250 MG capsule  08/19/14  Yes Historical Provider, MD  nortriptyline (PAMELOR) 10 MG capsule Take 10 mg by mouth at bedtime.   Yes Historical Provider, MD  predniSONE (DELTASONE) 10 MG tablet Take 10 mg by mouth daily.   Yes Historical Provider, MD  Topiramate (TOPAMAX PO) Take by mouth.   Yes Historical Provider, MD  topiramate (TOPAMAX) 50 MG tablet  08/09/14  Yes Historical Provider, MD  benzonatate (TESSALON) 100 MG capsule Take 100 mg by mouth 3 (three) times daily as needed. 08/09/14   Historical Provider, MD  levocetirizine Elita Boone) 5 MG tablet  08/14/14   Historical Provider, MD   Social  History   Social History  . Marital Status: Single    Spouse Name: N/A  . Number of Children: N/A  . Years of Education: BA   Occupational History  . [employer]    . [employer]    Social History Main Topics  . Smoking status: Never Smoker   . Smokeless tobacco: Not on file  . Alcohol Use: No     Comment: Rare  . Drug Use: No  . Sexual Activity: Yes    Birth Control/ Protection: Condom   Other Topics Concern  . 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 Hypertension Mother   . Depression Mother   . Asthma Mother   . Bipolar disorder Mother   . Asthma Sister     Review of Systems    Constitutional: Negative for fever, chills, diaphoresis and fatigue.  HENT: Positive for congestion, dental problem, postnasal drip, rhinorrhea, sinus pressure, sore throat and voice change. Negative for ear pain.   Respiratory: Positive for cough and shortness of breath. Negative for wheezing.   Cardiovascular: Negative for chest pain and leg swelling.  Gastrointestinal: Negative for nausea, vomiting, abdominal pain and diarrhea.  Genitourinary: Negative for enuresis.  Skin: Negative for rash.  Neurological: Positive for headaches.       Objective:   Physical Exam  Constitutional: She is oriented to person, place, and time. She appears well-developed and well-nourished. No distress.  Obese  HENT:  Head: Normocephalic and atraumatic.  Right Ear: Tympanic membrane, external ear and ear canal normal.  Left Ear: Tympanic membrane, external ear and ear canal normal.  Nose: Mucosal edema and rhinorrhea present. Right sinus exhibits maxillary sinus tenderness. Right sinus exhibits no frontal sinus tenderness. Left sinus exhibits maxillary sinus tenderness. Left sinus exhibits no frontal sinus tenderness.  Mouth/Throat: Uvula is midline, oropharynx is clear and moist and mucous membranes are normal. No oropharyngeal exudate or posterior oropharyngeal edema.  Eyes: Conjunctivae and EOM are normal. Pupils are equal, round, and reactive to light.  Neck: Normal range of motion. Neck supple. No thyromegaly present.  Cardiovascular: Regular rhythm and normal heart sounds.  Tachycardia present.  Exam reveals no gallop and no friction rub.   No murmur heard. Pulmonary/Chest: Effort normal and breath sounds normal. No accessory muscle usage. No tachypnea. No respiratory distress. She has no decreased breath sounds. She has no wheezes. She has no rhonchi. She has no rales.  Moderate air movement  Musculoskeletal: Normal range of motion.  Lymphadenopathy:    She has no cervical adenopathy.   Neurological: She is alert and oriented to person, place, and time.  Skin: Skin is warm and dry. She is not diaphoretic.  Psychiatric: She has a normal mood and affect. Her behavior is normal.  Nursing note and vitals reviewed.  Filed Vitals:   12/22/14 1904  BP: 122/74  Pulse: 118  Temp: 98.8 F (37.1 C)  TempSrc: Oral  Resp: 20  Height: 5\' 7"  (1.702 m)  Weight: 199 lb (90.266 kg)  SpO2: 97%  PF: 310 L/min    [facility] reading (PRIMARY) by Dr. Katrinka Blazing. CXR: NAD  Results for orders placed or performed in visit on 12/22/14  POCT CBC  Result Value Ref Range   WBC 10.9 (A) 4.6 - 10.2 K/uL   Lymph, poc 1.8 0.6 - 3.4   POC LYMPH PERCENT 16.1 10 - 50 %L   MID (cbc)  0.7 0 - 0.9   POC MID % 6.2 0 - 12 %M   POC Granulocyte 8.5 (A) 2 - 6.9   Granulocyte percent 77.7 37 - 80 %G   RBC 5.01 4.04 - 5.48 M/uL   Hemoglobin 12.9 12.2 - 16.2 g/dL   HCT, POC 16.1 09.6 - 47.9 %   MCV 87.0 80 - 97 fL   MCH, POC 25.7 (A) 27 - 31.2 pg   MCHC 29.6 (A) 31.8 - 35.4 g/dL   RDW, POC 04.5 %   Platelet Count, POC 327 142 - 424 K/uL   MPV 7.6 0 - 99.8 fL    PEAK FLOWS: 310, 250  ALBUTEROL NEBULIZER ADMINISTERED.  Assessment & Plan:   . 1. Allergic rhinitis due to pollen   2. Asthma with acute exacerbation, mild intermittent   3. Acute maxillary sinusitis, recurrence not specified   4. Myasthenia gravis   5. Immunosuppression     1. Asthma Exacerbation: New. Secondary to allergic rhinitis worsening; s/p Albuterol nebulizer in office; increase Prednisone to 40mg  daily for five days then 20mg  daily for five days then 10mg  daily for five days then maintain baseline Prednisone dose. Increase Albuterol to qid scheduled for one week and then PRN.  Continue Tessalon Perles. 2.  Allergic Rhinitis: worsening/uncontrolled; increase Prednisone.  Continue Xyzal, Singulair. Rx for Flonase provided; also advised to start Afrin qhs for severe nasal congestion. 3. Acute maxillary sinusitis: New.  Most abx  contraindicated due to MG.  Has tolerated Tetracycline in past; rx provided; suffered with severe n/v with Doxy in past. 4.  Myasthenia Gravis: stable; maintained on daily Prednisone and Cellcept.  Followed by [facility]. 5. Immunocompromised: Warrants aggressive treatment of infections.   Orders Placed This Encounter  Procedures  . DG Chest 2 View    Standing Status: Future     Number of Occurrences: 1     Standing Expiration Date: 12/22/2015    Order Specific Question:  Reason for Exam (SYMPTOM  OR DIAGNOSIS REQUIRED)    Answer:  cough, asthma exacerbation, myasthenia gravis maintained on Cellcept and Prednisone 5mg     Order Specific Question:  Is the patient pregnant?    Answer:  No    Order Specific Question:  Preferred imaging location?    Answer:  External  . POCT CBC    Meds ordered this encounter  Medications  . albuterol (PROVENTIL) (2.5 MG/3ML) 0.083% nebulizer solution 2.5 mg    Sig:   . albuterol (PROVENTIL) (2.5 MG/3ML) 0.083% nebulizer solution    Sig: Take 3 mLs (2.5 mg total) by nebulization every 6 (six) hours as needed for wheezing or shortness of breath.    Dispense:  75 mL    Refill:  12  . tetracycline (ACHROMYCIN,SUMYCIN) 250 MG capsule    Sig: Take 1 capsule (250 mg total) by mouth 4 (four) times daily.    Dispense:  40 capsule    Refill:  0  . benzonatate (TESSALON) 100 MG capsule    Sig: Take 1-2 capsules (100-200 mg total) by mouth 3 (three) times daily as needed for cough.    Dispense:  90 capsule    Refill:  3  . levocetirizine (XYZAL) 5 MG tablet    Sig: Take 1 tablet (5 mg total) by mouth every evening.    Dispense:  30 tablet    Refill:  11  . montelukast (SINGULAIR) 10 MG tablet    Sig: Take 1 tablet (10 mg total) by mouth at bedtime.  Dispense:  30 tablet    Refill:  11    I personally performed the services described in this documentation, which was scribed in my presence. The recorded information has been reviewed and  considered.  Kristi Paulita Fujita, M.D. [facility] [address] [place], [place]  [address] [phone] phone [phone] fax

## 2015-01-06 ENCOUNTER — Encounter: Payer: Self-pay | Admitting: Gynecology

## 2015-01-06 ENCOUNTER — Ambulatory Visit (INDEPENDENT_AMBULATORY_CARE_PROVIDER_SITE_OTHER): Payer: 59 | Admitting: Gynecology

## 2015-01-06 ENCOUNTER — Telehealth: Payer: Self-pay | Admitting: *Deleted

## 2015-01-06 VITALS — BP 118/70

## 2015-01-06 DIAGNOSIS — N644 Mastodynia: Secondary | ICD-10-CM

## 2015-01-06 DIAGNOSIS — N6310 Unspecified lump in the right breast, unspecified quadrant: Secondary | ICD-10-CM

## 2015-01-06 NOTE — Telephone Encounter (Signed)
-----   Message from Dara Lords, MD sent at 01/06/2015 11:57 AM EDT ----- Schedule ultrasound of the right breast reference questionable patient felt mass 6:00 periphery. Physician exam normal

## 2015-01-06 NOTE — Telephone Encounter (Signed)
Order placed, breast center will contact pt to schedule. 

## 2015-01-06 NOTE — Progress Notes (Signed)
VALENCIA KASSA 1986/01/13 [id number]        29 y.o.  G0P0 presents complaining of generalized bilateral breast tenderness that comes and goes over the past several months. The patient thought that she felt a lump in her right breast several days ago. No nipple discharge. Overdue for annual exam.  Past medical history,surgical history, problem list, medications, allergies, family history and social history were all reviewed and documented in the EPIC chart.  Directed ROS with pertinent positives and negatives documented in the history of present illness/assessment and plan.  Exam: [name] Filed Vitals:   01/06/15 1144  BP: 118/70   General appearance:  Normal Both breast examined lying and sitting without masses retractions discharge adenopathy.  Area patient is pointing to is at 6:00 periphery of the right breast/chest wall. No palpable or visual abnormalities noted.  Assessment/Plan:  29 y.o. G0P0 with questionable mass right breast to her self-exam. Negative physician exam. Several months of generalized bilateral breast tenderness that comes and goes. We'll start with ultrasound of the right breast. If normal-appearing tissue then will follow with self exams and if any perceived abnormalities will represent for exam. Discussed options as far as breast tenderness to include possible low dose oral contraceptives. Patient is overdue for her annual exam and will follow up for this and then we'll further discuss at that time.    [name], 11:58 AM 01/06/2015

## 2015-01-06 NOTE — Patient Instructions (Signed)
Office will call you to arrange the ultrasound of the breast.  Follow up for annual exam as scheduled

## 2015-01-20 NOTE — Telephone Encounter (Signed)
Appointment 01/24/15 @ 3:10am pt aware.

## 2015-01-24 ENCOUNTER — Ambulatory Visit
Admission: RE | Admit: 2015-01-24 | Discharge: 2015-01-24 | Disposition: A | Discharge status: Home / Self Care | Payer: 59 | Source: Ambulatory Visit | Attending: Gynecology | Admitting: Gynecology

## 2015-01-24 DIAGNOSIS — N6310 Unspecified lump in the right breast, unspecified quadrant: Secondary | ICD-10-CM

## 2015-02-07 ENCOUNTER — Telehealth: Payer: Self-pay

## 2015-02-07 NOTE — Telephone Encounter (Signed)
PT  REQUESTING REFERR[facility] REGARDING [facility]ISIT [facility]    BEST PHONE FOR PT IS [phone]

## 2015-02-08 NOTE — Telephone Encounter (Signed)
Note    Her insurance requires updated referral - pt sees Dr. 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

## 2015-02-08 NOTE — Telephone Encounter (Signed)
I have completed the [facility].  I tried contacting her at the given phone number, but she didn't answer and the VM was full.  [facility].  Referral #: [id number] Start Date: 02/08/15   Valid to Date: 08/08/15 Specialist Name: Sherryll BurgerShah Ankoor

## 2015-03-10 ENCOUNTER — Encounter: Payer: Self-pay | Admitting: *Deleted

## 2015-03-25 ENCOUNTER — Encounter: Payer: 59 | Admitting: Gynecology

## 2015-05-18 ENCOUNTER — Encounter: Payer: 59 | Admitting: Gynecology

## 2015-06-30 ENCOUNTER — Encounter: Payer: Self-pay | Admitting: Gynecology

## 2015-07-15 ENCOUNTER — Encounter: Payer: Self-pay | Admitting: Gynecology

## 2015-09-07 ENCOUNTER — Encounter: Payer: Self-pay | Admitting: Gynecology

## 2015-09-11 ENCOUNTER — Other Ambulatory Visit: Payer: Self-pay | Admitting: Family Medicine

## 2015-10-18 ENCOUNTER — Encounter: Payer: Self-pay | Admitting: Gynecology

## 2015-11-25 ENCOUNTER — Encounter: Payer: Self-pay | Admitting: Gynecology

## 2015-11-25 ENCOUNTER — Ambulatory Visit (INDEPENDENT_AMBULATORY_CARE_PROVIDER_SITE_OTHER): Payer: BLUE CROSS/BLUE SHIELD | Admitting: Gynecology

## 2015-11-25 VITALS — BP 120/76 | Ht 67.0 in | Wt 206.0 lb

## 2015-11-25 DIAGNOSIS — Z01419 Encounter for gynecological examination (general) (routine) without abnormal findings: Secondary | ICD-10-CM | POA: Diagnosis not present

## 2015-11-25 NOTE — Progress Notes (Signed)
    Sandy MeyerJasmine N Baker 01/18/1986 [id number]        30 y.o.  G0P0  for annual exam.  Doing well without complaints  Past medical history,surgical history, problem list, medications, allergies, family history and social history were all reviewed and documented as reviewed in the EPIC chart.  ROS:  Performed with pertinent positives and negatives included in the history, assessment and plan.   Additional significant findings :  None   Exam: Kennon PortelaKim Baker assistant Vitals:   11/25/15 0923  BP: 120/76  Weight: 206 lb (93.4 kg)  Height: 5\' 7"  (1.702 m)   Body mass index is 32.26 kg/m.  General appearance:  Normal affect, orientation and appearance. Skin: Grossly normal HEENT: Without gross lesions.  No cervical or supraclavicular adenopathy. Thyroid normal.  Lungs:  Clear without wheezing, rales or rhonchi Cardiac: RR, without RMG Abdominal:  Soft, nontender, without masses, guarding, rebound, organomegaly or hernia Breasts:  Examined lying and sitting without masses, retractions, discharge or axillary adenopathy. Pelvic:  Ext/BUS/Vagina normal  Cervix normal Pap smear done  Uterus retroverted, normal size, shape and contour, midline and mobile nontender   Adnexa without masses or tenderness    Anus and perineum normal     Assessment/Plan:  30 y.o. G0P0 female for annual exam with regular menses, abstinent birth control.   1. Contraception. Patient not sexually active nor plans to be any time soon. Contraception discussed and declined. 2. Pap smear 2014. Pap smear done today. No history of significant abnormal Pap smears. Plan repeat Pap smear at 3 year intervals per current screening guidelines. 3. STD screening offered and declined. 4. Breast health. SBE monthly reviewed. 5. Health maintenance. Patient reports routine blood work done elsewhere. Follow up in one year, sooner as needed.   Dara LordsFONTAINE,Sandy Baker P MD, 9:47 AM 11/25/2015

## 2015-11-25 NOTE — Addendum Note (Signed)
Addended by: Dayna BarkerGARDNER, Kayvan Hoefling K on: 11/25/2015 10:18 AM   Modules accepted: Orders

## 2015-11-25 NOTE — Patient Instructions (Signed)

## 2015-11-28 LAB — PAP IG W/ RFLX HPV ASCU

## 2015-12-21 DIAGNOSIS — G43009 Migraine without aura, not intractable, without status migrainosus: Secondary | ICD-10-CM | POA: Insufficient documentation

## 2015-12-26 DIAGNOSIS — R471 Dysarthria and anarthria: Secondary | ICD-10-CM | POA: Insufficient documentation

## 2015-12-28 ENCOUNTER — Ambulatory Visit (INDEPENDENT_AMBULATORY_CARE_PROVIDER_SITE_OTHER): Payer: BLUE CROSS/BLUE SHIELD | Admitting: Physician Assistant

## 2015-12-28 VITALS — BP 132/90 | HR 117 | Temp 98.6°F | Resp 17 | Ht 67.0 in | Wt 207.0 lb

## 2015-12-28 DIAGNOSIS — R252 Cramp and spasm: Secondary | ICD-10-CM

## 2015-12-28 LAB — CBC WITH DIFFERENTIAL/PLATELET
BASOS PCT: 0 %
Basophils Absolute: 0 cells/uL (ref 0–200)
Eosinophils Absolute: 268 cells/uL (ref 15–500)
Eosinophils Relative: 2 %
HEMATOCRIT: 36.8 % (ref 35.0–45.0)
Hemoglobin: 11.1 g/dL — ABNORMAL LOW (ref 11.7–15.5)
LYMPHS ABS: 2412 {cells}/uL (ref 850–3900)
LYMPHS PCT: 18 %
MCH: 27.7 pg (ref 27.0–33.0)
MCHC: 30.2 g/dL — ABNORMAL LOW (ref 32.0–36.0)
MCV: 91.8 fL (ref 80.0–100.0)
MONO ABS: 1072 {cells}/uL — AB (ref 200–950)
MPV: 9.6 fL (ref 7.5–12.5)
Monocytes Relative: 8 %
NEUTROS ABS: 9648 {cells}/uL — AB (ref 1500–7800)
Neutrophils Relative %: 72 %
PLATELETS: 319 10*3/uL (ref 140–400)
RBC: 4.01 MIL/uL (ref 3.80–5.10)
RDW: 13.4 % (ref 11.0–15.0)
WBC: 13.4 10*3/uL — AB (ref 3.8–10.8)

## 2015-12-28 LAB — COMPLETE METABOLIC PANEL WITH GFR
ALBUMIN: 4.9 g/dL (ref 3.6–5.1)
ALK PHOS: 38 U/L (ref 33–115)
ALT: 14 U/L (ref 6–29)
AST: 17 U/L (ref 10–30)
BILIRUBIN TOTAL: 0.4 mg/dL (ref 0.2–1.2)
BUN: 7 mg/dL (ref 7–25)
CALCIUM: 9.2 mg/dL (ref 8.6–10.2)
CO2: 33 mmol/L — AB (ref 20–31)
CREATININE: 0.52 mg/dL (ref 0.50–1.10)
Chloride: 99 mmol/L (ref 98–110)
Glucose, Bld: 92 mg/dL (ref 65–99)
Potassium: 4 mmol/L (ref 3.5–5.3)
Sodium: 139 mmol/L (ref 135–146)
TOTAL PROTEIN: 6.3 g/dL (ref 6.1–8.1)

## 2015-12-28 LAB — MAGNESIUM: MAGNESIUM: 1.8 mg/dL (ref 1.5–2.5)

## 2015-12-28 LAB — CALCIUM, IONIZED: Calcium, Ion: 5.1 mg/dL (ref 4.8–5.6)

## 2015-12-28 NOTE — Patient Instructions (Signed)
     IF you received an x-ray today, you will receive an invoice from [facility]. Please contact [facility] at [phone] with questions or concerns regarding your invoice.   IF you received labwork today, you will receive an invoice from [facility]. Please contact [facility] at [phone] with questions or concerns regarding your invoice.   Our billing staff will not be able to assist you with questions regarding bills from these companies.  You will be contacted with the lab results as soon as they are available. The fastest way to get your results is to activate your My Chart account. Instructions are located on the last page of this paperwork. If you have not heard from us regarding the results in 2 weeks, please contact this office.      

## 2015-12-28 NOTE — Progress Notes (Addendum)
   12/28/2015 3:53 PM   DOB: 11/22/1985 / MRN: [id number]  SUBJECTIVE:  Sandy Baker is a 30 y.o. female presenting for labs.  She is a primary patient here.  She is here at the request of Sandy NoseHolly O'Sullivan [name] [phone]) who felt it necessary to evaluate her electrolytes after [facility].   The patient has a history of myasthenia gravis. Overall she feels well today, reports she has a history of tachycardia.  Denies chest pain, presyncope and palpitations.    She is allergic to amoxicillin; botox [botulinum toxin type a]; iodine; latex; penicillins; quinine derivatives; relpax [eletriptan]; saline; and zithromax [azithromycin].   She  has a past medical history of Asthma; Autoimmune disease (HCC); Fibromyalgia; Migraines; Myasthenia gravis (HCC); Sleep apnea; and TMJ (dislocation of temporomandibular joint).    She  reports that she has never smoked. She has never used smokeless tobacco. She reports that she does not drink alcohol or use drugs. She  reports that she does not currently engage in sexual activity. She reports using the following method of birth control/protection: Condom. The patient  has a past surgical history that includes Thymectomy.  Her family history includes Asthma in her mother and sister; Bipolar disorder in her mother; Depression in her mother; Hypertension in her mother.  Review of Systems  Constitutional: Negative for fever.  Respiratory: Negative for cough.   Cardiovascular: Negative for chest pain, palpitations and orthopnea.  Gastrointestinal: Negative for nausea.    The problem list and medications were reviewed and updated by myself where necessary and exist elsewhere in the encounter.   OBJECTIVE:  BP 132/90 (BP Location: Left Arm, Patient Position: Sitting, Cuff Size: Large)   Pulse (!) 117   Temp 98.6 F (37 C) (Oral)   Resp 17   Ht 5\' 7"  (1.702 m)   Wt 207 lb (93.9 kg)   LMP 12/17/2015   SpO2 93%   BMI 32.42  kg/m   Physical Exam  Cardiovascular:  No extrasystoles are present. Tachycardia present.  Exam reveals no decreased pulses.   Vitals reviewed.   No results found for this or any previous visit (from the past 72 hour(s)).  No results found.  ASSESSMENT AND PLAN  Sandy Baker was seen today for pt needs labwork and ear problem.  Diagnoses and all orders for this visit:  Muscle cramps: Pt's number [phone].  Requesting clinician's fax number: [phone].  Her Tachycardia is not new. She feels overall well today.   -     Calcium, ionized -     CBC with Differential -     COMPLETE METABOLIC PANEL WITH GFR -     Magnesium    The patient is advised to call or return to clinic if she does not see an improvement in symptoms, or to seek the care of the closest emergency department if she worsens with the above plan.   Deliah BostonMichael Crystian Frith, MHS, [name] [facility] 12/28/2015 3:53 PM

## 2015-12-30 DIAGNOSIS — G935 Compression of brain: Secondary | ICD-10-CM | POA: Insufficient documentation

## 2016-01-20 DIAGNOSIS — R000 Tachycardia, unspecified: Secondary | ICD-10-CM | POA: Insufficient documentation

## 2016-02-01 ENCOUNTER — Other Ambulatory Visit: Payer: Self-pay | Admitting: Family Medicine

## 2016-02-28 ENCOUNTER — Other Ambulatory Visit: Payer: Self-pay | Admitting: Family Medicine

## 2016-03-03 NOTE — Telephone Encounter (Signed)
12/2015 last ov here

## 2016-03-21 ENCOUNTER — Other Ambulatory Visit: Payer: Self-pay | Admitting: Family Medicine

## 2016-03-21 ENCOUNTER — Other Ambulatory Visit: Payer: Self-pay | Admitting: Physician Assistant

## 2016-03-26 NOTE — Telephone Encounter (Signed)
Last visit 12/2015

## 2016-03-27 ENCOUNTER — Ambulatory Visit (INDEPENDENT_AMBULATORY_CARE_PROVIDER_SITE_OTHER): Payer: BLUE CROSS/BLUE SHIELD | Admitting: Family Medicine

## 2016-03-27 ENCOUNTER — Other Ambulatory Visit: Payer: Self-pay | Admitting: Family Medicine

## 2016-03-27 VITALS — BP 126/88 | HR 113 | Temp 98.7°F | Resp 17 | Ht 67.0 in | Wt 215.0 lb

## 2016-03-27 DIAGNOSIS — J45909 Unspecified asthma, uncomplicated: Secondary | ICD-10-CM | POA: Diagnosis not present

## 2016-03-27 MED ORDER — ALBUTEROL SULFATE HFA 108 (90 BASE) MCG/ACT IN AERS: 2.0000 | INHALATION_SPRAY | RESPIRATORY_TRACT | 4 refills | Status: DC | PRN

## 2016-03-27 MED ORDER — BENZONATATE 100 MG PO CAPS: 100.0000 mg | ORAL_CAPSULE | Freq: Three times a day (TID) | ORAL | 3 refills | Status: DC | PRN

## 2016-03-27 NOTE — Telephone Encounter (Deleted)
Already filled during today visit

## 2016-03-27 NOTE — Progress Notes (Signed)
Patient ID: Sandy Baker, female    DOB: 01/23/1986, 30 y.o.   MRN: [id number]  PCP: Allean FoundSMITH,CANDACE THIELE, MD  Chief Complaint  Patient presents with  . Medication Refill    albuterol and tessalon    Subjective:   HPI 30 year old presents for medication refill of albuterol and tessalon pearls. Chronic medical problems include: Myasthenia Gravis, Sleep apnea, Asthma , and Chronic Migraines. Pt chronically takes albuterol and tessalon pearls during acute asthma exacerbations.She was advised that an office visit was required in order to process refill. Denies any recent use or need for albuterol inhaler, wheezing, or cough. She was advised by her neurologist to keep her asthma symptoms controlled in order to try and suppress another flare of her Myasthenia Gravis.   Social History   Social History  . Marital status: Single    Spouse name: N/A  . Number of children: N/A  . Years of education: BA   Occupational History  . [employer]    . [employer]    Social History Main Topics  . Smoking status: Never Smoker  . Smokeless tobacco: Never Used  . Alcohol use No  . Drug use: No  . Sexual activity: Not Currently    Birth control/ protection: Condom     Comment: 1st intercourse 30 yo-Fewer than 5 partners   Other Topics Concern  . 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 Hypertension Mother   . Depression Mother   . Asthma Mother   . Bipolar disorder Mother   . Asthma Sister     Review of Systems See HPI  There are no active problems to display for this patient.    Prior to Admission medications   Medication Sig Start Date End Date Taking? Authorizing Provider  albuterol (PROVENTIL) (2.5 MG/3ML) 0.083% nebulizer solution USE 1 VIAL VIA NEBULIZER EVERY 6 HOURS AS NEEDED FOR WHEEZING OR SHORTNESS OF BREATH 03/26/16  Yes Ofilia NeasMichael L Clark, [name]  ALBUTEROL IN Inhale into the  lungs.   Yes Historical Provider, MD  benzonatate (TESSALON) 100 MG capsule Take 1-2 capsules (100-200 mg total) by mouth 3 (three) times daily as needed for cough. 12/22/14  Yes Ethelda ChickKristi M Smith, MD  butalbital-aspirin-caffeine [facility](FIORINAL) [phone] MG per tablet Take 1 tablet by mouth 2 (two) times daily as needed for headache.   Yes Historical Provider, MD  levocetirizine (XYZAL) 5 MG tablet TAKE 1 TABLET(5 MG) BY MOUTH EVERY EVENING 02/03/16  Yes Ethelda ChickKristi M Smith, MD  levocetirizine (XYZAL) 5 MG tablet TAKE 1 TABLET(5 MG) BY MOUTH EVERY EVENING 03/26/16  Yes Ofilia NeasMichael L Clark, [name]  meloxicam (MOBIC) 15 MG tablet Take 15 mg by mouth daily.   Yes Historical Provider, MD  montelukast (SINGULAIR) 10 MG tablet TAKE 1 TABLET(10 MG) BY MOUTH AT BEDTIME 02/03/16  Yes Ethelda ChickKristi M Smith, MD  montelukast (SINGULAIR) 10 MG tablet TAKE 1 TABLET(10 MG) BY MOUTH AT BEDTIME 03/26/16  Yes Ofilia NeasMichael L Clark, [name]  mycophenolate (CELLCEPT) 250 MG capsule  08/19/14  Yes Historical Provider, MD  nortriptyline (PAMELOR) 10 MG capsule Take 10 mg by mouth at bedtime.   Yes Historical Provider, MD  pantoprazole (PROTONIX) 40 MG tablet Take 40 mg by mouth daily.   Yes Historical Provider, MD  predniSONE (DELTASONE) 10 MG tablet Take 10 mg by mouth daily.   Yes Historical Provider, MD  tetracycline (ACHROMYCIN,SUMYCIN) 250 MG  capsule Take 1 capsule (250 mg total) by mouth 4 (four) times daily. 12/22/14  Yes Ethelda ChickKristi M Smith, MD  topiramate (TOPAMAX) 50 MG tablet  08/09/14  Yes Historical Provider, MD  traMADol (ULTRAM) 50 MG tablet Take by mouth every 6 (six) hours as needed.   Yes Historical Provider, MD  verapamil (VERELAN PM) 240 MG 24 hr capsule Take 240 mg by mouth at bedtime.   Yes Historical Provider, MD     Allergies  Allergen Reactions  . Amoxicillin   . Botox [Botulinum Toxin Type A]   . Iodine   . Latex   . Penicillins   . Quinine Derivatives   . Relpax [Eletriptan]   . Saline   . Zithromax [Azithromycin]          Objective:  Physical Exam  Constitutional: She is oriented to person, place, and time. She appears well-developed and well-nourished.  HENT:  Head: Normocephalic and atraumatic.  Right Ear: External ear normal.  Left Ear: External ear normal.  Mouth/Throat: Oropharynx is clear and moist.  Eyes: Conjunctivae are normal. Pupils are equal, round, and reactive to light.  Neck: Normal range of motion. Neck supple.  Cardiovascular: Normal rate, regular rhythm, normal heart sounds and intact distal pulses.   Pulmonary/Chest: Effort normal and breath sounds normal. She has no wheezes. She exhibits no tenderness.  Neurological: She is alert and oriented to person, place, and time.  Skin: Skin is warm and dry.  Psychiatric: She has a normal mood and affect. Her behavior is normal. Judgment and thought content normal.   Today's Vitals   03/27/16 1437  BP: 126/88  Pulse: (!) 113  Resp: 17  Temp: 98.7 F (37.1 C)  TempSrc: Oral  SpO2: 93%  Weight: 215 lb (97.5 kg)  Height: 5\' 7"  (1.702 m)     Assessment & Plan:  1. Moderate asthma without complication, unspecified whether persistent  . benzonatate (TESSALON) 100 MG capsule    Sig: Take 1-2 capsules (100-200 mg total) by mouth 3 (three) times daily as needed for cough.    Dispense:  90 capsule  . albuterol (PROVENTIL HFA;VENTOLIN HFA) 108 (90 Base) MCG/ACT inhaler    Sig: Inhale 2 puffs into the lungs every 4 (four) hours as needed for wheezing or shortness of breath (cough, shortness of breath or wheezing.).    Dispense:  1 Inhaler   -Advised if increased frequency in use of albuterol occurs, follow-up for further evaluation and treatment.  Godfrey PickKimberly S. Tiburcio PeaHarris, MSN, FNP-C [facility]

## 2016-03-27 NOTE — Telephone Encounter (Signed)
Albuterol filled with refills during today's visit.

## 2016-03-27 NOTE — Telephone Encounter (Signed)
Seen today teesalon sent in needs inhaler

## 2016-03-27 NOTE — Telephone Encounter (Signed)
Pt states that she got the solution for the inhaler but didn't get the albuterol inhaler but she was looking for a refill on her tesslon pearls please respond

## 2016-03-27 NOTE — Patient Instructions (Signed)
     IF you received an x-ray today, you will receive an invoice from [facility]. Please contact [facility] at [phone] with questions or concerns regarding your invoice.   IF you received labwork today, you will receive an invoice from [facility]. Please contact [facility] at [phone] with questions or concerns regarding your invoice.   Our billing staff will not be able to assist you with questions regarding bills from these companies.  You will be contacted with the lab results as soon as they are available. The fastest way to get your results is to activate your My Chart account. Instructions are located on the last page of this paperwork. If you have not heard from us regarding the results in 2 weeks, please contact this office.     

## 2016-05-14 ENCOUNTER — Encounter: Payer: Self-pay | Admitting: Physician Assistant

## 2016-08-18 ENCOUNTER — Other Ambulatory Visit: Payer: Self-pay | Admitting: Family Medicine

## 2016-09-17 ENCOUNTER — Other Ambulatory Visit: Payer: Self-pay | Admitting: Family Medicine

## 2016-09-17 NOTE — Telephone Encounter (Signed)
Please call patient ---- please clarify who pt's PCP is.  If it is a provider at PCP, she is due for follow-up with primary care provider.  Please schedule OV in upcoming month.

## 2016-10-09 ENCOUNTER — Encounter: Payer: Self-pay | Admitting: Physician Assistant

## 2016-10-09 ENCOUNTER — Ambulatory Visit (INDEPENDENT_AMBULATORY_CARE_PROVIDER_SITE_OTHER): Payer: BLUE CROSS/BLUE SHIELD | Admitting: Physician Assistant

## 2016-10-09 VITALS — BP 155/107 | HR 107 | Temp 98.6°F | Resp 18 | Ht 67.32 in

## 2016-10-09 DIAGNOSIS — J301 Allergic rhinitis due to pollen: Secondary | ICD-10-CM

## 2016-10-09 DIAGNOSIS — J45909 Unspecified asthma, uncomplicated: Secondary | ICD-10-CM | POA: Diagnosis not present

## 2016-10-09 DIAGNOSIS — R079 Chest pain, unspecified: Secondary | ICD-10-CM

## 2016-10-09 DIAGNOSIS — K219 Gastro-esophageal reflux disease without esophagitis: Secondary | ICD-10-CM | POA: Diagnosis not present

## 2016-10-09 MED ORDER — ZONISAMIDE 100 MG PO CAPS: 100.0000 mg | ORAL_CAPSULE | Freq: Every day | ORAL | Status: AC

## 2016-10-09 MED ORDER — MONTELUKAST SODIUM 10 MG PO TABS: ORAL_TABLET | ORAL | 1 refills | Status: DC

## 2016-10-09 MED ORDER — ALBUTEROL SULFATE 1.25 MG/3ML IN NEBU
1.0000 | INHALATION_SOLUTION | Freq: Four times a day (QID) | RESPIRATORY_TRACT | 12 refills | Status: DC | PRN
Start: 2016-10-09 — End: 2017-08-14

## 2016-10-09 MED ORDER — LEVOCETIRIZINE DIHYDROCHLORIDE 5 MG PO TABS: ORAL_TABLET | ORAL | 3 refills | Status: DC

## 2016-10-09 MED ORDER — BENZONATATE 100 MG PO CAPS: 100.0000 mg | ORAL_CAPSULE | Freq: Three times a day (TID) | ORAL | 3 refills | Status: DC | PRN

## 2016-10-09 MED ORDER — RANITIDINE HCL 150 MG PO TABS: 150.0000 mg | ORAL_TABLET | Freq: Two times a day (BID) | ORAL | 2 refills | Status: DC

## 2016-10-09 NOTE — Progress Notes (Signed)
PRIMARY CARE AT [facility] [address] [place] [address] [phone]  Date:  10/09/2016   Name:  Sandy Baker   DOB:  Jan 16, 1986   MRN:  [id number]  PCP:  Ethelda Chick, MD    History of Present Illness:  Sandy Baker is a 31 y.o. female patient who presents to PCP with  Chief Complaint  Patient presents with  . Medication Refill     1 week ago, she had chest pain while bending over to pick up something.  Chest pain lasted about 4-5 days, then went away.  2nd time she went to reach for something and had chest pain.  She has no associated sob, nausea, or diaphoresis.   She does have reflux for she take protonix.  She denies sour taste.   She is taking 1250 mg of the cellcept, 20mg  of the prednisone.   Sees 11/21/2016--hilary migraines.  12/05/2016--MG followed by [facility]  Patient Active Problem List   Diagnosis Date Noted  . Tachycardia 01/20/2016  . 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 Dysarthria 12/26/2015  . Migraine without aura and without status migrainosus, not intractable 12/21/2015  . Obstructive sleep apnea syndrome 11/05/2014  . GERD (gastroesophageal reflux disease) 09/12/2013  . Asthma without status asthmaticus 09/09/2013  . Fibromyalgia 09/09/2013  . Allergic rhinitis 09/09/2013  . Myasthenia gravis (HCC) 06/19/2011    Past Medical History:  Diagnosis Date  . Asthma   . Autoimmune disease (HCC)   . Fibromyalgia   . Migraines   . Myasthenia gravis (HCC)   . Sleep apnea   . TMJ (dislocation of temporomandibular joint)     Past Surgical History:  Procedure Laterality Date  . THYMECTOMY      Social History  Substance Use Topics  . Smoking status: Never Smoker  . Smokeless tobacco: Never Used  . Alcohol use No    Family History  Problem Relation Age of Onset  . Hypertension Mother   . Depression Mother   . Asthma Mother   . Bipolar disorder Mother   . Asthma Sister     Allergies  Allergen Reactions  . Amoxicillin   . Botox  [Botulinum Toxin Type A]   . Iodine   . Latex   . Penicillins   . Quinine Derivatives   . Relpax [Eletriptan]   . Saline   . Zithromax [Azithromycin]     Medication list has been reviewed and updated.  Current Outpatient Prescriptions on File Prior to Visit  Medication Sig Dispense Refill  . albuterol (PROVENTIL HFA;VENTOLIN HFA) 108 (90 Base) MCG/ACT inhaler Inhale 2 puffs into the lungs every 4 (four) hours as needed for wheezing or shortness of breath (cough, shortness of breath or wheezing.). 1 Inhaler 4  . ALBUTEROL IN Inhale into the lungs.    . benzonatate (TESSALON) 100 MG capsule Take 1-2 capsules (100-200 mg total) by mouth 3 (three) times daily as needed for cough. 90 capsule 3  . butalbital-aspirin-caffeine (FIORINAL) 50-325-40 MG per tablet Take 1 tablet by mouth 2 (two) times daily as needed for headache.    . levocetirizine (XYZAL) 5 MG tablet TAKE 1 TABLET(5 MG) BY MOUTH EVERY EVENING 30 tablet 0  . meloxicam (MOBIC) 15 MG tablet Take 15 mg by mouth daily.    . montelukast (SINGULAIR) 10 MG tablet TAKE 1 TABLET(10 MG) BY MOUTH AT BEDTIME 30 tablet 0  . mycophenolate (CELLCEPT) 250 MG capsule     . nortriptyline (PAMELOR) 10 MG capsule Take  10 mg by mouth at bedtime.    . pantoprazole (PROTONIX) 40 MG tablet Take 40 mg by mouth daily.    . predniSONE (DELTASONE) 10 MG tablet Take 10 mg by mouth daily.    Marland Kitchen. tetracycline (ACHROMYCIN,SUMYCIN) 250 MG capsule Take 1 capsule (250 mg total) by mouth 4 (four) times daily. 40 capsule 0  . topiramate (TOPAMAX) 50 MG tablet   3  . traMADol (ULTRAM) 50 MG tablet Take by mouth every 6 (six) hours as needed.    . verapamil (VERELAN PM) 240 MG 24 hr capsule Take 240 mg by mouth at bedtime.     Current Facility-Administered Medications on File Prior to Visit  Medication Dose Route Frequency Provider Last Rate Last Dose  . albuterol (PROVENTIL) (2.5 MG/3ML) 0.083% nebulizer solution 2.5 mg  2.5 mg Nebulization Once Ethelda ChickSmith, Kristi M, MD         ROS ROS otherwise unremarkable unless listed above.  Physical Examination: BP (!) 155/107   Pulse (!) 107   Temp 98.6 F (37 C) (Oral)   Resp 18   Ht 5' 7.32" (1.71 m)   LMP 09/25/2016   SpO2 98%  Ideal Body Weight: Weight in (lb) to have BMI = 25: 160.8  Physical Exam  Constitutional: She is oriented to person, place, and time. She appears well-developed and well-nourished. No distress.  HENT:  Head: Normocephalic and atraumatic.  Right Ear: External ear normal.  Left Ear: External ear normal.  Nose: Nose normal.  Mouth/Throat: Oropharynx is clear and moist. No oropharyngeal exudate.  Eyes: Conjunctivae and EOM are normal. Pupils are equal, round, and reactive to light.  Cardiovascular: Normal rate, regular rhythm, normal heart sounds and intact distal pulses.  Exam reveals no friction rub.   No murmur heard. Pulmonary/Chest: Effort normal and breath sounds normal. No respiratory distress. She has no wheezes.  Neurological: She is alert and oriented to person, place, and time.  Skin: She is not diaphoretic.  Psychiatric: She has a normal mood and affect. Her behavior is normal.     Assessment and Plan: Sandy Baker is a 31 y.o. female who is here today for cc of medication refill. ekg reviewed and normal. Refilling medications.  Advised if chest pain continues, to follow up.  Seasonal allergic rhinitis due to pollen - Plan: benzonatate (TESSALON) 100 MG capsule, levocetirizine (XYZAL) 5 MG tablet, montelukast (SINGULAIR) 10 MG tablet  Moderate asthma without complication, unspecified whether persistent - Plan: albuterol (ACCUNEB) 1.25 MG/3ML nebulizer solution, levocetirizine (XYZAL) 5 MG tablet, montelukast (SINGULAIR) 10 MG tablet  Gastroesophageal reflux disease, esophagitis presence not specified - Plan: ranitidine (ZANTAC) 150 MG tablet  Chest pain, unspecified type - Plan: EKG 12-Lead  Trena PlattStephanie English, [name] [facility] 7/6/20184:57 PM

## 2016-10-09 NOTE — Patient Instructions (Addendum)
     IF you received an x-ray today, you will receive an invoice from [facility]. Please contact [facility] at [phone] with questions or concerns regarding your invoice.   IF you received labwork today, you will receive an invoice from [facility]. Please contact [facility] at [phone] with questions or concerns regarding your invoice.   Our billing staff will not be able to assist you with questions regarding bills from these companies.  You will be contacted with the lab results as soon as they are available. The fastest way to get your results is to activate your My Chart account. Instructions are located on the last page of this paperwork. If you have not heard from us regarding the results in 2 weeks, please contact this office.     

## 2016-10-11 ENCOUNTER — Ambulatory Visit: Payer: BLUE CROSS/BLUE SHIELD | Admitting: Physician Assistant

## 2016-11-21 ENCOUNTER — Other Ambulatory Visit: Payer: Self-pay | Admitting: Physician Assistant

## 2016-11-21 DIAGNOSIS — J45909 Unspecified asthma, uncomplicated: Secondary | ICD-10-CM

## 2016-11-21 DIAGNOSIS — J301 Allergic rhinitis due to pollen: Secondary | ICD-10-CM

## 2016-11-21 DIAGNOSIS — K219 Gastro-esophageal reflux disease without esophagitis: Secondary | ICD-10-CM

## 2016-11-28 ENCOUNTER — Ambulatory Visit (INDEPENDENT_AMBULATORY_CARE_PROVIDER_SITE_OTHER): Payer: BLUE CROSS/BLUE SHIELD | Admitting: Gynecology

## 2016-11-28 ENCOUNTER — Encounter: Payer: Self-pay | Admitting: Gynecology

## 2016-11-28 VITALS — BP 122/78 | Ht 68.0 in | Wt 209.0 lb

## 2016-11-28 DIAGNOSIS — Z01419 Encounter for gynecological examination (general) (routine) without abnormal findings: Secondary | ICD-10-CM

## 2016-11-28 NOTE — Progress Notes (Signed)
    Sandy Baker 1986-03-31 [id number]        31 y.o.  G0P0 for annual exam.  Doing well without complaints.  Past medical history,surgical history, problem list, medications, allergies, family history and social history were all reviewed and documented as reviewed in the EPIC chart.  ROS:  Performed with pertinent positives and negatives included in the history, assessment and plan.   Additional significant findings :  None   Exam: Kennon Portela assistant Vitals:   11/28/16 0904  BP: 122/78  Weight: 209 lb (94.8 kg)  Height: 5\' 8"  (1.727 m)   Body mass index is 31.78 kg/m.  General appearance:  Normal affect, orientation and appearance. Skin: Grossly normal HEENT: Without gross lesions.  No cervical or supraclavicular adenopathy. Thyroid normal.  Lungs:  Clear without wheezing, rales or rhonchi Cardiac: RR, without RMG Abdominal:  Soft, nontender, without masses, guarding, rebound, organomegaly or hernia Breasts:  Examined lying and sitting without masses, retractions, discharge or axillary adenopathy. Pelvic:  Ext, BUS, Vagina: Normal  Cervix: Normal  Uterus: Retroverted, normal size, shape and contour, midline and mobile nontender   Adnexa: Without masses or tenderness    Anus and perineum: Normal   Rectovaginal: Normal sphincter tone without palpated masses or tenderness.    Assessment/Plan:  31 y.o. G0P0 female for annual exam with regular menses, not sexually active.   1. Contraception. Patient not sexually active nor plans to be any time soon. Declines contraception. 2. Breast health. SBE monthly reviewed. Plan mammography closer to 40 area in 3. Pap smear 2017. No Pap smear done today. No history of abnormal Pap smears previously. Plan repeat Pap smear at 3 year interval per current screening guidelines. 4. STD screening offered and declined. 5. Health maintenance. No routine lab work done as patient does this elsewhere. Follow up 1 year, sooner as  needed.   [name], 9:27 AM 11/28/2016

## 2016-11-28 NOTE — Patient Instructions (Signed)
Follow up in one year for annual exam 

## 2016-12-24 ENCOUNTER — Telehealth: Payer: Self-pay | Admitting: *Deleted

## 2016-12-24 NOTE — Telephone Encounter (Signed)
FYI pt called c/o heavy bleeding changing tampon every hour, had normal cycle on 12/08/16 lasted until 12/13/16. Pt said bleeding then started again on 12/23/16 heavy, not sexually active, states this had never happened before. I explained OV needed, pt said she is at work and unable to come in today but can come tomorrow. Patient on schedule at 2:30pm tomorrow

## 2016-12-24 NOTE — Telephone Encounter (Signed)
Ideal would be office visit today. If it is patient's choice not to do so then I would watch for now with office visit tomorrow if it continues.

## 2016-12-25 ENCOUNTER — Encounter: Payer: Self-pay | Admitting: Gynecology

## 2016-12-25 ENCOUNTER — Ambulatory Visit (INDEPENDENT_AMBULATORY_CARE_PROVIDER_SITE_OTHER): Payer: BLUE CROSS/BLUE SHIELD | Admitting: Gynecology

## 2016-12-25 VITALS — BP 130/80

## 2016-12-25 DIAGNOSIS — N926 Irregular menstruation, unspecified: Secondary | ICD-10-CM | POA: Diagnosis not present

## 2016-12-25 NOTE — Addendum Note (Signed)
Addended by: Rushie Goltz on: 12/25/2016 03:11 PM   Modules accepted: Orders

## 2016-12-25 NOTE — Patient Instructions (Signed)
Follow-up if irregular bleeding continues 

## 2016-12-25 NOTE — Progress Notes (Signed)
    Sandy Baker [birth year] [id number]        31 y.o.  G0P0 presents with episode of irregular bleeding. Having regular monthly menses with last normal period 1 September. Started bleeding again 16 September heavy with clots for a day or so and now is tapering off and resolving. No significant discomfort. No overt precipitating events. Not sexually active.  Past medical history,surgical history, problem list, medications, allergies, family history and social history were all reviewed and documented in the EPIC chart.  Directed ROS with pertinent positives and negatives documented in the history of present illness/assessment and plan.  Exam: Kennon Portela assistant Vitals:   12/25/16 1440  BP: 130/80   General appearance:  Normal Abdomen soft nontender without masses guarding rebound Pelvic external BUS vagina with scant bleeding. Cervix normal. Uterus normal size midline mobile nontender. Adnexa without masses or tenderness.  Assessment/Plan:  31 y.o. G0P0 with single episode of heavy bleeding midcycle. Pregnancy not a possibility. Check baseline CBC and TSH. We discussed differential to include ovulatory/hormonal dysfunction, structural normality such as polyps and fibroids. At this point since it is an isolated event I recommend keeping a menstrual calendar and if with continued to have irregular bleeding she'll call to pursue a more involved evaluation to include sonohysterogram and other hormonal studies. Assuming they regulate and resume monthly menses then she'll follow.    [name], 2:59 PM 12/25/2016

## 2016-12-25 NOTE — Addendum Note (Signed)
Addended by: Dayna Barker on: 12/25/2016 04:04 PM   Modules accepted: Orders

## 2016-12-26 ENCOUNTER — Other Ambulatory Visit: Payer: Self-pay | Admitting: Family Medicine

## 2016-12-26 ENCOUNTER — Other Ambulatory Visit: Payer: Self-pay | Admitting: Physician Assistant

## 2016-12-26 DIAGNOSIS — K219 Gastro-esophageal reflux disease without esophagitis: Secondary | ICD-10-CM

## 2016-12-28 ENCOUNTER — Other Ambulatory Visit: Payer: BLUE CROSS/BLUE SHIELD

## 2016-12-28 DIAGNOSIS — N926 Irregular menstruation, unspecified: Secondary | ICD-10-CM

## 2016-12-28 LAB — CBC WITH DIFFERENTIAL/PLATELET
BASOS PCT: 0.5 %
Basophils Absolute: 55 cells/uL (ref 0–200)
EOS ABS: 99 {cells}/uL (ref 15–500)
Eosinophils Relative: 0.9 %
HEMATOCRIT: 36.7 % (ref 35.0–45.0)
Hemoglobin: 10.5 g/dL — ABNORMAL LOW (ref 11.7–15.5)
Lymphs Abs: 1980 cells/uL (ref 850–3900)
MCH: 23.4 pg — AB (ref 27.0–33.0)
MCHC: 28.6 g/dL — ABNORMAL LOW (ref 32.0–36.0)
MCV: 81.7 fL (ref 80.0–100.0)
MPV: 11 fL (ref 7.5–12.5)
Monocytes Relative: 10.8 %
NEUTROS ABS: 7678 {cells}/uL (ref 1500–7800)
Neutrophils Relative %: 69.8 %
Platelets: 287 10*3/uL (ref 140–400)
RBC: 4.49 10*6/uL (ref 3.80–5.10)
RDW: 13.1 % (ref 11.0–15.0)
Total Lymphocyte: 18 %
WBC: 11 10*3/uL — AB (ref 3.8–10.8)
WBCMIX: 1188 {cells}/uL — AB (ref 200–950)

## 2016-12-28 LAB — TSH: TSH: 0.85 mIU/L

## 2017-01-27 ENCOUNTER — Other Ambulatory Visit: Payer: Self-pay | Admitting: Physician Assistant

## 2017-01-27 ENCOUNTER — Other Ambulatory Visit: Payer: Self-pay | Admitting: Family Medicine

## 2017-02-01 ENCOUNTER — Ambulatory Visit (INDEPENDENT_AMBULATORY_CARE_PROVIDER_SITE_OTHER): Payer: BLUE CROSS/BLUE SHIELD | Admitting: Urgent Care

## 2017-02-01 ENCOUNTER — Ambulatory Visit (INDEPENDENT_AMBULATORY_CARE_PROVIDER_SITE_OTHER): Payer: BLUE CROSS/BLUE SHIELD

## 2017-02-01 ENCOUNTER — Encounter: Payer: Self-pay | Admitting: Urgent Care

## 2017-02-01 VITALS — BP 128/98 | HR 114 | Temp 99.1°F | Resp 16 | Ht 68.0 in | Wt 216.8 lb

## 2017-02-01 DIAGNOSIS — R0600 Dyspnea, unspecified: Secondary | ICD-10-CM

## 2017-02-01 DIAGNOSIS — R0781 Pleurodynia: Secondary | ICD-10-CM | POA: Diagnosis not present

## 2017-02-01 DIAGNOSIS — R059 Cough, unspecified: Secondary | ICD-10-CM

## 2017-02-01 DIAGNOSIS — I10 Essential (primary) hypertension: Secondary | ICD-10-CM

## 2017-02-01 DIAGNOSIS — G7000 Myasthenia gravis without (acute) exacerbation: Secondary | ICD-10-CM | POA: Diagnosis not present

## 2017-02-01 DIAGNOSIS — R9389 Abnormal findings on diagnostic imaging of other specified body structures: Secondary | ICD-10-CM

## 2017-02-01 DIAGNOSIS — R0789 Other chest pain: Secondary | ICD-10-CM

## 2017-02-01 DIAGNOSIS — G4733 Obstructive sleep apnea (adult) (pediatric): Secondary | ICD-10-CM | POA: Diagnosis not present

## 2017-02-01 DIAGNOSIS — R05 Cough: Secondary | ICD-10-CM

## 2017-02-01 NOTE — Progress Notes (Signed)
   MRN: [id number] DOB: 09/10/1985  Subjective:   Velia MeyerJasmine N Pietila is a 31 y.o. female presenting for chief complaint of Cough (clear/green plegm, vomiting loose bowels, took a neb Tuesday, headache )  Reports 1 month history of productive cough, has difficulty breathing, pleuritic pain, dyspnea walking distances of 10 feet or less relieved with rest. Cough wakes up patient from sleep, coughs up phlegm. Has also had sharp throat pain intermittently with associated nausea with vomiting. Has been using albuterol inhaler, nebulizer without any relief. Denies fever, abdominal pain, sinus pain, ear pain. Has had a Thymectomy (03/2008). Uses CPAP for sleep apnea. Denies smoking cigarettes. No alcohol use. Denies history of heart disease, heart failure. Manages HTN with verapamil.  [name] has a current medication list which includes the following prescription(s): albuterol, benzonatate, butalbital-aspirin-caffeine, candesartan, levocetirizine, meloxicam, montelukast, mycophenolate, pantoprazole, prednisone, ranitidine, tramadol, ventolin hfa, verapamil, and zonisamide. Also is allergic to amoxicillin; penicillins; relpax [eletriptan]; botox [botulinum toxin type a]; iodine; latex; quinine derivatives; saline; and zithromax [azithromycin].  [name]  has a past medical history of Asthma; Autoimmune disease (HCC); Chiari I malformation (HCC); Fibromyalgia; Migraines; Myasthenia gravis (HCC); Sleep apnea; and TMJ (dislocation of temporomandibular joint). Also  has a past surgical history that includes Thymectomy.  Objective:   Vitals: BP (!) 128/98   Pulse (!) 114   Temp 99.1 F (37.3 C)   Resp 16   Ht 5\' 8"  (1.727 m)   Wt 216 lb 12.8 oz (98.3 kg)   SpO2 95%   BMI 32.96 kg/m   BP Readings from Last 3 Encounters:  02/01/17 (!) 128/98  12/25/16 130/80  11/28/16 122/78   Physical Exam  Constitutional: She is oriented to person, place, and time. She appears well-developed and well-nourished.  HENT:    Mouth/Throat: Oropharynx is clear and moist.  Neck: Normal range of motion. Neck supple.  Cardiovascular: Normal rate, regular rhythm and intact distal pulses.  Exam reveals no gallop and no friction rub.   No murmur heard. Pulmonary/Chest: No respiratory distress. She has no wheezes. She has no rales.  Diminished lung sounds.  Lymphadenopathy:    She has no cervical adenopathy.  Neurological: She is alert and oriented to person, place, and time.  Skin: Skin is warm and dry.  Psychiatric: She has a normal mood and affect.   Dg Chest 2 View  Result Date: 02/01/2017 CLINICAL DATA:  Cough.  Chest tightness. EXAM: CHEST  2 VIEW COMPARISON:  None. FINDINGS: Prior median sternotomy and CABG. Cardiomegaly with normal pulmonary vascularity. Mild basilar pulmonary interstitial prominence. Small left pleural effusion cannot be excluded . Low lung volumes. Small left pleural effusion cannot be excluded. No pneumothorax . IMPRESSION: 1. Prior CABG.  Cardiomegaly. 2. Mild basilar interstitial prominence and small left pleural effusion. Mild CHF cannot be completely excluded. Low lung volumes. Electronically Signed   By: Maisie Fushomas  Register   On: 02/01/2017 15:00    Assessment and Plan :   1. Cough 2. Chest tightness 3. Dyspnea, unspecified type 4. Pleuritic pain 5. Abnormal chest x-ray 6. Myasthenia gravis (HCC) - Called and spoke with her neurologist's (Dr. Garen LahHobson-Webb) nurse, [place], at [phone]. We notified them that [facility].   7. OSA (obstructive sleep apnea) 8. Essential hypertension - Will monitor.    Wallis BambergMario Livian Vanderbeck, [name] [facility] [phone] 02/01/2017  2:50 PM

## 2017-02-01 NOTE — Patient Instructions (Addendum)
Please report to the ER at [facility] for an urgent evaluation.    Myasthenia Gravis Myasthenia gravis (MG) means severe weakness. It is a long-term (chronic) condition that causes weakness in the muscles you can control (voluntary muscles). MG can affect any voluntary muscle. The muscles most often affected are the ones that control:  Eye movement.  Facial movements.  Swallowing.  MG is an autoimmune disease, which means that your body's defense system (immune system) attacks healthy parts of your body instead of germs and other things that make you sick. When you have MG, your immune system makes proteins (antibodies) that block the chemical (acetylcholine) your body needs to send nerve signals to your muscles. This causes muscle weakness. What are the causes? The exact cause of MG is unknown. One possible cause is an enlarged thymus gland, which is located under your breastbone. What are the signs or symptoms? The earliest symptom of MG is muscle weakness that gets worse with activity and gets better after rest. Other symptoms of MG may include:  Drooping eyelids.  Double vision.  Loss of facial expression.  Trouble chewing and swallowing.  Slurred speech.  A waddling walk.  Weakness of the arms, hands, and legs.  Trouble breathing is the most dangerous symptom of MG. Sudden and severe difficulty breathing (myasthenic crisis) may require emergency breathing support. This symptom sometimes happens after:  Infection.  Fever.  Drug reaction.  How is this diagnosed? It can be hard to diagnose MG because muscle weakness is a common symptom in many conditions. Your health care provider will do a physical exam. You may also have tests that will help make a diagnosis. These may include:  A blood test.  A test using the medicine edrophonium. This medicine increases muscle strength by slowing the breakdown of acetylcholine.  Tests to measure nerve conduction to muscle  (electromyography).  An imaging study of the chest (CT or MRI).  How is this treated? Treatment can improve muscle strength. Sometimes symptoms of MG go away for a while (remission) and you can stop treatment. Possible treatments include:  Medicine.  Removal of the thymus gland (thymectomy). This may result in a long remission for some people.  Follow these instructions at home:  Take medicines only as directed by your health care provider.  Get plenty of rest to conserve your energy.  Take frequent breaks to rest your eyes.  Maintain a healthy diet and a healthy weight.  Do not use any tobacco products including cigarettes, chewing tobacco, or electronic cigarettes. If you need help quitting, ask your health care provider.  Keep all follow-up visits as directed by your health care provider. This is important. Contact a health care provider if:  Your symptoms get worse after a fever or infection.  You have a reaction to a medicine you are taking.  Your symptoms change or get worse. Get help right away if: You have trouble breathing. This information is not intended to replace advice given to you by your health care provider. Make sure you discuss any questions you have with your health care provider. Document Released: 07/02/2000 Document Revised: 09/01/2015 Document Reviewed: 05/27/2013 Elsevier Interactive Patient Education  2018 [employer]     IF you received an x-ray today, you will receive an invoice from [facility]. Please contact [facility] at [phone] with questions or concerns regarding your invoice.   IF you received labwork today, you will receive an invoice from [facility]. Please contact [facility] at [phone] with questions  or concerns regarding your invoice.   Our billing staff will not be able to assist you with questions regarding bills from these companies.  You will be contacted with the lab results as soon as they are  available. The fastest way to get your results is to activate your My Chart account. Instructions are located on the last page of this paperwork. If you have not heard from [place] regarding the results in 2 weeks, please contact this office.

## 2017-02-26 ENCOUNTER — Encounter: Payer: Self-pay | Admitting: Urgent Care

## 2017-02-26 ENCOUNTER — Ambulatory Visit (INDEPENDENT_AMBULATORY_CARE_PROVIDER_SITE_OTHER): Payer: BLUE CROSS/BLUE SHIELD | Admitting: Urgent Care

## 2017-02-26 ENCOUNTER — Ambulatory Visit (INDEPENDENT_AMBULATORY_CARE_PROVIDER_SITE_OTHER): Payer: BLUE CROSS/BLUE SHIELD

## 2017-02-26 VITALS — BP 146/84 | HR 102 | Temp 98.5°F | Resp 18 | Wt 214.0 lb

## 2017-02-26 DIAGNOSIS — G4733 Obstructive sleep apnea (adult) (pediatric): Secondary | ICD-10-CM

## 2017-02-26 DIAGNOSIS — J45909 Unspecified asthma, uncomplicated: Secondary | ICD-10-CM

## 2017-02-26 DIAGNOSIS — J9611 Chronic respiratory failure with hypoxia: Secondary | ICD-10-CM | POA: Diagnosis not present

## 2017-02-26 DIAGNOSIS — R059 Cough, unspecified: Secondary | ICD-10-CM

## 2017-02-26 DIAGNOSIS — R0602 Shortness of breath: Secondary | ICD-10-CM

## 2017-02-26 DIAGNOSIS — R05 Cough: Secondary | ICD-10-CM

## 2017-02-26 DIAGNOSIS — G7000 Myasthenia gravis without (acute) exacerbation: Secondary | ICD-10-CM

## 2017-02-26 DIAGNOSIS — R0781 Pleurodynia: Secondary | ICD-10-CM | POA: Diagnosis not present

## 2017-02-26 DIAGNOSIS — Z8709 Personal history of other diseases of the respiratory system: Secondary | ICD-10-CM

## 2017-02-26 NOTE — Progress Notes (Signed)
  MRN: [id number] DOB: 01/30/1986  Subjective:   Sandy Baker is a 31 y.o. female with pmh of myasthenia gravis, asthma, right sided heart failure, chronic respiratory failure presenting for 1 day history of cough, shortness of breath, wheezing. Cough elicits chest pain over diaphragm area. She has f/u with her neurologist managing her MG on 03/07/2017, [facility] on 03/18/2017. She is on Cellcept, prednisone. She is using Trilogy and is no longer on bipap. She did try her nebulizer once over night without any difference in her symptoms.  Sandy Baker has a current medication list which includes the following prescription(s): albuterol, butalbital-aspirin-caffeine, candesartan, levocetirizine, meloxicam, montelukast, mycophenolate, pantoprazole, prednisone, ranitidine, tramadol, ventolin hfa, verapamil, and zonisamide. Also is allergic to amoxicillin; penicillins; relpax [eletriptan]; botox [botulinum toxin type a]; iodine; latex; quinine derivatives; saline; and zithromax [azithromycin].  Sandy Baker  has a past medical history of Asthma, Autoimmune disease (HCC), Chiari I malformation (HCC), Fibromyalgia, Migraines, Myasthenia gravis (HCC), Sleep apnea, and TMJ (dislocation of temporomandibular joint). Also  has a past surgical history that includes Thymectomy.  Objective:   Vitals: BP (!) 146/84 (BP Location: Right Arm, Patient Position: Sitting, Cuff Size: Large)   Pulse (!) 102   Temp 98.5 F (36.9 C) (Oral)   Resp 18   Wt 214 lb (97.1 kg)   LMP 01/26/2017   SpO2 100%   BMI 32.54 kg/m   Pulse was 92 bpm on recheck by PA-Colletta Spillers.  Physical Exam  Constitutional: She is oriented to person, place, and time. She appears well-developed and well-nourished.  HENT:  Mouth/Throat: Oropharynx is clear and moist.  Cardiovascular: Normal rate, regular rhythm and intact distal pulses. Exam reveals no gallop and no friction rub.  No murmur heard. Pulmonary/Chest: No stridor. No respiratory distress.  She has no wheezes. She has no rales.  Neurological: She is alert and oriented to person, place, and time.   Dg Chest 2 View  Result Date: 02/26/2017 CLINICAL DATA:  Cough EXAM: CHEST  2 VIEW COMPARISON:  02/01/2017 FINDINGS: Postsurgical changes are again seen. Cardiac shadow is within normal limits. The lungs are well aerated bilaterally. No focal infiltrate or sizable effusion is seen. No bony abnormality is noted. IMPRESSION: No active cardiopulmonary disease. Electronically Signed   By: Alcide CleverMark  Lukens M.D.   On: 02/26/2017 18:20   Assessment and Plan :   This case was precepted with Dr. Neva SeatGreene.   1. Cough 2. Shortness of breath 3. Pleuritic pain 4. Myasthenia gravis (HCC) 5. Obstructive sleep apnea syndrome 6. Asthma without status asthmaticus without complication, unspecified asthma severity, unspecified whether persistent 7. Chronic respiratory failure with hypoxia (HCC) - Despite the fact that patient's vitals are much better than her last OV where she was admitted for acute on chronic respiratory failure, we recommended patient report to the [facility] for an evaluation. I also recommended she contact her neurologist to get her recommendations and discuss the work we've done up to now including normal chest x-ray, reassuring vital signs and clear lung sounds, no signs of accessory muscle use or retractions. Patient agreed.   Wallis BambergMario Dacotah Cabello, [name] Primary Care at [facility] [phone] 02/26/2017  5:46 PM

## 2017-02-26 NOTE — Patient Instructions (Addendum)
Myasthenia Gravis Myasthenia gravis (MG) means severe weakness. It is a long-term (chronic) condition that causes weakness in the muscles you can control (voluntary muscles). MG can affect any voluntary muscle. The muscles most often affected are the ones that control:  Eye movement.  Facial movements.  Swallowing.  MG is an autoimmune disease, which means that your body's defense system (immune system) attacks healthy parts of your body instead of germs and other things that make you sick. When you have MG, your immune system makes proteins (antibodies) that block the chemical (acetylcholine) your body needs to send nerve signals to your muscles. This causes muscle weakness. What are the causes? The exact cause of MG is unknown. One possible cause is an enlarged thymus gland, which is located under your breastbone. What are the signs or symptoms? The earliest symptom of MG is muscle weakness that gets worse with activity and gets better after rest. Other symptoms of MG may include:  Drooping eyelids.  Double vision.  Loss of facial expression.  Trouble chewing and swallowing.  Slurred speech.  A waddling walk.  Weakness of the arms, hands, and legs.  Trouble breathing is the most dangerous symptom of MG. Sudden and severe difficulty breathing (myasthenic crisis) may require emergency breathing support. This symptom sometimes happens after:  Infection.  Fever.  Drug reaction.  How is this diagnosed? It can be hard to diagnose MG because muscle weakness is a common symptom in many conditions. Your health care provider will do a physical exam. You may also have tests that will help make a diagnosis. These may include:  A blood test.  A test using the medicine edrophonium. This medicine increases muscle strength by slowing the breakdown of acetylcholine.  Tests to measure nerve conduction to muscle (electromyography).  An imaging study of the chest (CT or MRI).  How is  this treated? Treatment can improve muscle strength. Sometimes symptoms of MG go away for a while (remission) and you can stop treatment. Possible treatments include:  Medicine.  Removal of the thymus gland (thymectomy). This may result in a long remission for some people.  Follow these instructions at home:  Take medicines only as directed by your health care provider.  Get plenty of rest to conserve your energy.  Take frequent breaks to rest your eyes.  Maintain a healthy diet and a healthy weight.  Do not use any tobacco products including cigarettes, chewing tobacco, or electronic cigarettes. If you need help quitting, ask your health care provider.  Keep all follow-up visits as directed by your health care provider. This is important. Contact a health care provider if:  Your symptoms get worse after a fever or infection.  You have a reaction to a medicine you are taking.  Your symptoms change or get worse. Get help right away if: You have trouble breathing. This information is not intended to replace advice given to you by your health care provider. Make sure you discuss any questions you have with your health care provider. Document Released: 07/02/2000 Document Revised: 09/01/2015 Document Reviewed: 05/27/2013 Elsevier Interactive Patient Education  2018 Elsevier Inc.     Cough, Adult A cough helps to clear your throat and lungs. A cough may last only 2-3 weeks (acute), or it may last longer than 8 weeks (chronic). Many different things can cause a cough. A cough may be a sign of an illness or another medical condition. Follow these instructions at home:  Pay attention to any changes in your cough.  Take medicines only as told by your doctor. ? If you were prescribed an antibiotic medicine, take it as told by your doctor. Do not stop taking it even if you start to feel better. ? Talk with your doctor before you try using a cough medicine.  Drink enough fluid to  keep your pee (urine) clear or pale yellow.  If the air is dry, use a cold steam vaporizer or humidifier in your home.  Stay away from things that make you cough at work or at home.  If your cough is worse at night, try using extra pillows to raise your head up higher while you sleep.  Do not smoke, and try not to be around smoke. If you need help quitting, ask your doctor.  Do not have caffeine.  Do not drink alcohol.  Rest as needed. Contact a doctor if:  You have new problems (symptoms).  You cough up yellow fluid (pus).  Your cough does not get better after 2-3 weeks, or your cough gets worse.  Medicine does not help your cough and you are not sleeping well.  You have pain that gets worse or pain that is not helped with medicine.  You have a fever.  You are losing weight and you do not know why.  You have night sweats. Get help right away if:  You cough up blood.  You have trouble breathing.  Your heartbeat is very fast. This information is not intended to replace advice given to you by your health care provider. Make sure you discuss any questions you have with your health care provider. Document Released: 12/07/2010 Document Revised: 09/01/2015 Document Reviewed: 06/02/2014 Elsevier Interactive Patient Education  2018 [employer]     IF you received an x-ray today, you will receive an invoice from [facility]. Please contact [facility] at [phone] with questions or concerns regarding your invoice.   IF you received labwork today, you will receive an invoice from [facility]. Please contact [facility] at [phone] with questions or concerns regarding your invoice.   Our billing staff will not be able to assist you with questions regarding bills from these companies.  You will be contacted with the lab results as soon as they are available. The fastest way to get your results is to activate your My Chart account. Instructions are located  on the last page of this paperwork. If you have not heard from us regarding the results in 2 weeks, please contact this office.

## 2017-03-01 ENCOUNTER — Other Ambulatory Visit: Payer: Self-pay | Admitting: Physician Assistant

## 2017-04-01 ENCOUNTER — Other Ambulatory Visit: Payer: Self-pay | Admitting: Family Medicine

## 2017-04-01 ENCOUNTER — Other Ambulatory Visit: Payer: Self-pay | Admitting: Physician Assistant

## 2017-04-01 DIAGNOSIS — J301 Allergic rhinitis due to pollen: Secondary | ICD-10-CM

## 2017-04-01 DIAGNOSIS — K219 Gastro-esophageal reflux disease without esophagitis: Secondary | ICD-10-CM

## 2017-04-13 ENCOUNTER — Other Ambulatory Visit: Payer: Self-pay

## 2017-04-13 ENCOUNTER — Encounter: Payer: Self-pay | Admitting: Physician Assistant

## 2017-04-13 ENCOUNTER — Ambulatory Visit: Payer: BLUE CROSS/BLUE SHIELD | Admitting: Physician Assistant

## 2017-04-13 VITALS — BP 110/66 | HR 99 | Temp 98.5°F | Resp 16 | Ht 67.25 in | Wt 209.4 lb

## 2017-04-13 DIAGNOSIS — H6593 Unspecified nonsuppurative otitis media, bilateral: Secondary | ICD-10-CM | POA: Diagnosis not present

## 2017-04-13 MED ORDER — SULFAMETHOXAZOLE-TRIMETHOPRIM 800-160 MG PO TABS: 1.0000 | ORAL_TABLET | Freq: Two times a day (BID) | ORAL | 0 refills | Status: DC

## 2017-04-13 MED ORDER — PREDNISONE 20 MG PO TABS: ORAL_TABLET | ORAL | 0 refills | Status: DC

## 2017-04-13 NOTE — Patient Instructions (Addendum)
   Otitis Media, Adult Otitis media is redness, soreness, and puffiness (swelling) in the space just behind your eardrum (middle ear). It may be caused by allergies or infection. It often happens along with a cold. Follow these instructions at home:  Take your medicine as told. Finish it even if you start to feel better.  Only take over-the-counter or prescription medicines for pain, discomfort, or fever as told by your doctor.  Follow up with your doctor as told. Contact a doctor if:  You have otitis media only in one ear, or bleeding from your nose, or both.  You notice a lump on your neck.  You are not getting better in 3-5 days.  You feel worse instead of better. Get help right away if:  You have pain that is not helped with medicine.  You have puffiness, redness, or pain around your ear.  You get a stiff neck.  You cannot move part of your face (paralysis).  You notice that the bone behind your ear hurts when you touch it. This information is not intended to replace advice given to you by your health care provider. Make sure you discuss any questions you have with your health care provider. Document Released: 09/12/2007 Document Revised: 09/01/2015 Document Reviewed: 10/21/2012 Elsevier Interactive Patient Education  2017 Elsevier Inc.    IF you received an x-ray today, you will receive an invoice from [facility]. Please contact [facility] at [phone] with questions or concerns regarding your invoice.   IF you received labwork today, you will receive an invoice from [facility]. Please contact [facility] at [phone] with questions or concerns regarding your invoice.   Our billing staff will not be able to assist you with questions regarding bills from these companies.  You will be contacted with the lab results as soon as they are available. The fastest way to get your results is to activate your My Chart account. Instructions are located on the  last page of this paperwork. If you have not heard from us regarding the results in 2 weeks, please contact this office.     

## 2017-04-17 ENCOUNTER — Encounter: Payer: Self-pay | Admitting: Physician Assistant

## 2017-04-20 NOTE — Progress Notes (Signed)
PRIMARY CARE AT [facility] [address], [place] [address] [phone]  Date:  04/13/2017   Name:  Sandy Baker   DOB:  February 11, 1986   MRN:  [id number]  PCP:  Doristine Bosworth, MD    History of Present Illness:  Sandy Baker is a 32 y.o. female patient who presents to PCP with  Chief Complaint  Patient presents with  . Cough    started 04/08/17 with ear problem pressure  . Sore Throat     Patient has had cough for the last week that appears to be lingering.  She has no production 7 days of ear pain and pressure.  No drainage.  Nasal congestion Runny nose. No facial pain Sore throat.   Cough is present at night No sob or dyspnea.   Patient Active Problem List   Diagnosis Date Noted  . Tachycardia 01/20/2016  . 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 Dysarthria 12/26/2015  . Migraine without aura and without status migrainosus, not intractable 12/21/2015  . Obstructive sleep apnea syndrome 11/05/2014  . GERD (gastroesophageal reflux disease) 09/12/2013  . Asthma without status asthmaticus 09/09/2013  . Fibromyalgia 09/09/2013  . Allergic rhinitis 09/09/2013  . Myasthenia gravis (HCC) 06/19/2011    Past Medical History:  Diagnosis Date  . Asthma   . Autoimmune disease (HCC)   . Chiari I malformation (HCC)   . Fibromyalgia   . Migraines   . Myasthenia gravis (HCC)   . Sleep apnea   . TMJ (dislocation of temporomandibular joint)     Past Surgical History:  Procedure Laterality Date  . THYMECTOMY      Social History   Tobacco Use  . Smoking status: Never Smoker  . Smokeless tobacco: Never Used  Substance Use Topics  . Alcohol use: No    Alcohol/week: 0.0 oz  . Drug use: No    Family History  Problem Relation Age of Onset  . Hypertension Mother   . Depression Mother   . Asthma Mother   . Bipolar disorder Mother   . Asthma Sister     Allergies  Allergen Reactions  . Amoxicillin Swelling  . Penicillins Swelling  . Relpax [Eletriptan]  Swelling  . Botox [Botulinum Toxin Type A]   . Iodine Hives  . Latex Hives  . Quinine Derivatives Swelling  . Saline     Metal sensation in mouth  . Zithromax [Azithromycin] Swelling    Medication list has been reviewed and updated.  Current Outpatient Medications on File Prior to Visit  Medication Sig Dispense Refill  . albuterol (ACCUNEB) 1.25 MG/3ML nebulizer solution Take 3 mLs (1.25 mg total) by nebulization every 6 (six) hours as needed for wheezing. 75 mL 12  . benzonatate (TESSALON) 100 MG capsule TAKE 1 TO 2 CAPSULES(100 TO 200 MG) BY MOUTH THREE TIMES DAILY AS NEEDED FOR COUGH 90 capsule 0  . butalbital-aspirin-caffeine (FIORINAL) 50-325-40 MG per tablet Take 1 tablet by mouth 2 (two) times daily as needed for headache.    . candesartan (ATACAND) 8 MG tablet Take 8 mg by mouth daily.    Marland Kitchen levocetirizine (XYZAL) 5 MG tablet TAKE 1 TABLET(5 MG) BY MOUTH EVERY EVENING 90 tablet 3  . meloxicam (MOBIC) 15 MG tablet Take 15 mg by mouth daily.    . montelukast (SINGULAIR) 10 MG tablet TAKE 1 TABLET(10 MG) BY MOUTH AT BEDTIME 90 tablet 0  . mycophenolate (CELLCEPT) 250 MG capsule     . pantoprazole (PROTONIX) 40 MG tablet  Take 40 mg by mouth daily.    . predniSONE (DELTASONE) 10 MG tablet Take 20 mg by mouth daily.     . ranitidine (ZANTAC) 150 MG tablet TAKE 1 TABLET(150 MG) BY MOUTH TWICE DAILY 60 tablet 0  . VENTOLIN HFA 108 (90 Base) MCG/ACT inhaler INHALE 2 PUFFS INTO THE LUNGS EVERY 4 HOURS AS NEEDED FOR WHEEZING OR SHORTNESS OF BREATH OR COUGH 18 g 0  . verapamil (VERELAN PM) 240 MG 24 hr capsule Take 240 mg by mouth at bedtime.    Marland Kitchen. zonisamide (ZONEGRAN) 100 MG capsule Take 1 capsule (100 mg total) by mouth daily. (Patient taking differently: Take 200 mg by mouth daily. )    . ranitidine (ZANTAC) 150 MG tablet TAKE 1 TABLET(150 MG) BY MOUTH TWICE DAILY (Patient not taking: Reported on 04/13/2017) 60 tablet 0   No current facility-administered medications on file prior to visit.      ROS ROS otherwise unremarkable unless listed above.  Physical Examination: BP 110/66 (BP Location: Right Arm, Patient Position: Sitting, Cuff Size: Large)   Pulse 99   Temp 98.5 F (36.9 C) (Oral)   Resp 16   Ht 5' 7.25" (1.708 m)   Wt 209 lb 6.4 oz (95 kg)   LMP 03/27/2017   SpO2 95%   BMI 32.55 kg/m  Ideal Body Weight: Weight in (lb) to have BMI = 25: 160.5  Physical Exam  Constitutional: She is oriented to person, place, and time. She appears well-developed and well-nourished. No distress.  HENT:  Head: Normocephalic and atraumatic.  Right Ear: External ear and ear canal normal. A middle ear effusion (fluid at the base behind tm.  no perforation.  no bulging.  ) is present.  Left Ear: External ear and ear canal normal.  Nose: Mucosal edema and rhinorrhea present. Right sinus exhibits no maxillary sinus tenderness and no frontal sinus tenderness. Left sinus exhibits no maxillary sinus tenderness and no frontal sinus tenderness.  Mouth/Throat: No uvula swelling. No oropharyngeal exudate, posterior oropharyngeal edema or posterior oropharyngeal erythema.  Eyes: Conjunctivae and EOM are normal. Pupils are equal, round, and reactive to light.  Cardiovascular: Normal rate and regular rhythm. Exam reveals no gallop, no distant heart sounds and no friction rub.  No murmur heard. Pulmonary/Chest: Effort normal. No respiratory distress. She has no decreased breath sounds. She has no wheezes. She has no rhonchi.  Lymphadenopathy:       Head (right side): No submandibular, no tonsillar, no preauricular and no posterior auricular adenopathy present.       Head (left side): No submandibular, no tonsillar, no preauricular and no posterior auricular adenopathy present.  Neurological: She is alert and oriented to person, place, and time.  Skin: She is not diaphoretic.  Psychiatric: She has a normal mood and affect. Her behavior is normal.     Assessment and Plan: Sandy Baker is a  32 y.o. female who is here today for cc of  Chief Complaint  Patient presents with  . Cough    started 04/08/17 with ear problem pressure  . Sore Throat   Given bactrim due to allergies, and abx not recommended in myasthenia gravis.   Follow up as needed. Bilateral non-suppurative otitis media - Plan: predniSONE (DELTASONE) 20 MG tablet, sulfamethoxazole-trimethoprim (BACTRIM DS,SEPTRA DS) 800-160 MG tablet  Trena PlattStephanie Voyd Groft, [name] [facility] 1/12/20197:34 PM

## 2017-07-17 ENCOUNTER — Encounter: Payer: Self-pay | Admitting: Physician Assistant

## 2017-07-23 ENCOUNTER — Telehealth: Payer: Self-pay | Admitting: Urgent Care

## 2017-07-23 DIAGNOSIS — J301 Allergic rhinitis due to pollen: Secondary | ICD-10-CM

## 2017-07-23 DIAGNOSIS — K219 Gastro-esophageal reflux disease without esophagitis: Secondary | ICD-10-CM

## 2017-07-24 NOTE — Telephone Encounter (Signed)
Tessalon 100 mg capsule and Zantac 150 mg tablet refill request  Pt of Dr. Creta Levin  [facility] [address] [place] - [address]

## 2017-07-25 NOTE — Telephone Encounter (Signed)
Patient wants to know why she has to have an appointment before she can have her prescriptions refilled.  She said she was in to see the provider just 6 months ago.  Please call [phone]

## 2017-07-25 NOTE — Telephone Encounter (Signed)
mychart message sent to pt about needing an apt for med refills

## 2017-07-26 NOTE — Telephone Encounter (Signed)
Spoke with pt.  She states she will [facility] the med[facility] for her.

## 2017-07-29 ENCOUNTER — Other Ambulatory Visit: Payer: Self-pay | Admitting: Urgent Care

## 2017-07-30 ENCOUNTER — Other Ambulatory Visit: Payer: Self-pay | Admitting: Physician Assistant

## 2017-08-14 ENCOUNTER — Other Ambulatory Visit: Payer: Self-pay

## 2017-08-14 ENCOUNTER — Ambulatory Visit: Payer: BC Managed Care – PPO | Admitting: Physician Assistant

## 2017-08-14 ENCOUNTER — Encounter: Payer: Self-pay | Admitting: Physician Assistant

## 2017-08-14 VITALS — BP 118/74 | HR 103 | Temp 99.2°F | Ht 67.0 in | Wt 213.6 lb

## 2017-08-14 DIAGNOSIS — J301 Allergic rhinitis due to pollen: Secondary | ICD-10-CM

## 2017-08-14 DIAGNOSIS — J45909 Unspecified asthma, uncomplicated: Secondary | ICD-10-CM | POA: Diagnosis not present

## 2017-08-14 DIAGNOSIS — K219 Gastro-esophageal reflux disease without esophagitis: Secondary | ICD-10-CM

## 2017-08-14 DIAGNOSIS — R471 Dysarthria and anarthria: Secondary | ICD-10-CM | POA: Diagnosis not present

## 2017-08-14 MED ORDER — PANTOPRAZOLE SODIUM 40 MG PO TBEC: 40.0000 mg | DELAYED_RELEASE_TABLET | Freq: Every day | ORAL | 3 refills | Status: DC

## 2017-08-14 MED ORDER — LEVOCETIRIZINE DIHYDROCHLORIDE 5 MG PO TABS: ORAL_TABLET | ORAL | 3 refills | Status: DC

## 2017-08-14 MED ORDER — ALBUTEROL SULFATE 1.25 MG/3ML IN NEBU: 1.0000 | INHALATION_SOLUTION | Freq: Four times a day (QID) | RESPIRATORY_TRACT | 12 refills | Status: DC | PRN

## 2017-08-14 MED ORDER — ALBUTEROL SULFATE HFA 108 (90 BASE) MCG/ACT IN AERS: INHALATION_SPRAY | RESPIRATORY_TRACT | 0 refills | Status: DC

## 2017-08-14 NOTE — Patient Instructions (Addendum)
  Please speak with the [facility] neurologist about your disarthria.    I have refilled you medications as you have requested.    IF you received an x-ray today, you will receive an invoice from [facility]. Please contact [facility] at [phone] with questions or concerns regarding your invoice.   IF you received labwork today, you will receive an invoice from [facility]. Please contact [facility] at [phone] with questions or concerns regarding your invoice.   Our billing staff will not be able to assist you with questions regarding bills from these companies.  You will be contacted with the lab results as soon as they are available. The fastest way to get your results is to activate your My Chart account. Instructions are located on the last page of this paperwork. If you have not heard from [place] regarding the results in 2 weeks, please contact this office.

## 2017-08-14 NOTE — Progress Notes (Signed)
[phone]  08/18/2017 12:14 PM   DOB: Aug 05, 1985 / MRN: [id number]  SUBJECTIVE:  Sandy Baker is a 32 y.o. female presenting for refills of asthma and allergy medications.  Patient is not missing doses at this time and feels well overall.  Denies shortness of breath.  She has a history of myasthenia gravis which manifest often as slurred speech.  She has slurred speech today.  She tells me "it feels like my tongue is just heavy."  She is managed at [facility].  She denies weakness, gait disturbance, headache.  She is allergic to aminoglycosides; amoxicillin; beta adrenergic blockers; bioflavonoids; macrolides and ketolides; magnesium; mometasone furo-formoterol fum; onabotulinumtoxina; penicillamine; penicillins; povidone-iodine; procainamide; quinolones; relpax [eletriptan]; sumatriptan; tubocurarine; botox [botulinum toxin type a]; iodine; latex; quinine derivatives; saline; sodium chloride; and zithromax [azithromycin].   She  has a past medical history of Asthma, Autoimmune disease (HCC), Chiari I malformation (HCC), Fibromyalgia, Migraines, Myasthenia gravis (HCC), Sleep apnea, and TMJ (dislocation of temporomandibular joint).    She  reports that she has never smoked. She has never used smokeless tobacco. She reports that she does not drink alcohol or use drugs. She  reports that she does not currently engage in sexual activity. She reports using the following method of birth control/protection: Condom. The patient  has a past surgical history that includes Thymectomy.  Her family history includes Asthma in her mother and sister; Bipolar disorder in her mother; Depression in her mother; Hypertension in her mother.  Review of Systems  Constitutional: Negative for chills, diaphoresis and fever.  Eyes: Negative.   Respiratory: Negative for cough, hemoptysis, sputum production, shortness of breath and wheezing.   Cardiovascular: Negative for chest pain, orthopnea and leg swelling.    Gastrointestinal: Negative for abdominal pain, blood in stool, constipation, diarrhea, heartburn, melena, nausea and vomiting.  Genitourinary: Negative for dysuria, flank pain, frequency, hematuria and urgency.  Skin: Negative for rash.  Neurological: Negative for dizziness, sensory change, speech change, focal weakness and headaches.    The problem list and medications were reviewed and updated by myself where necessary and exist elsewhere in the encounter.   OBJECTIVE:  BP 118/74 (BP Location: Left Arm, Patient Position: Sitting, Cuff Size: Large)   Pulse (!) 103   Temp 99.2 F (37.3 C) (Oral)   Ht  (1.702 m)   Wt 213 lb 9.6 oz (96.9 kg)   LMP 08/14/2017   SpO2 96%   BMI 33.45 kg/m   Wt Readings from Last 3 Encounters:  08/14/17 213 lb 9.6 oz (96.9 kg)  04/13/17 209 lb 6.4 oz (95 kg)  02/26/17 214 lb (97.1 kg)   Temp Readings from Last 3 Encounters:  08/14/17 99.2 F (37.3 C) (Oral)  04/13/17 98.5 F (36.9 C) (Oral)  02/26/17 98.5 F (36.9 C) (Oral)   BP Readings from Last 3 Encounters:  08/14/17 118/74  04/13/17 110/66  02/26/17 (!) 146/84   Pulse Readings from Last 3 Encounters:  08/14/17 (!) 103  04/13/17 99  02/26/17 (!) 102     Physical Exam  Constitutional: She is oriented to person, place, and time. She appears well-nourished. No distress.  Eyes: Pupils are equal, round, and reactive to light. EOM are normal.  Cardiovascular: Normal rate.  Pulmonary/Chest: Effort normal.  Abdominal: She exhibits no distension.  Neurological: She is alert and oriented to person, place, and time. She has normal strength and normal reflexes. She is not disoriented. She displays no atrophy. No cranial nerve deficit or sensory deficit. She  exhibits normal muscle tone. Coordination and gait normal.  Normal gait.  Normal finger-to-nose.  Skin: Skin is dry. She is not diaphoretic.  Psychiatric: She has a normal mood and affect. Her behavior is normal.  Vitals  reviewed.   No results found for this or any previous visit (from the past 72 hour(s)).  No results found.  ASSESSMENT AND PLAN:  [name] was seen today for medication refill.  Diagnoses and all orders for this visit:  Dysarthria: I have spoken to the neuro surgical fellow on-call at [facility] who will call the patient after reviewing her chart.  No red flags at this time as she has a history of dysarthria without any new neurological symptoms today.  Seasonal allergic rhinitis due to pollen -     levocetirizine (XYZAL) 5 MG tablet; TAKE 1 TABLET(5 MG) BY MOUTH EVERY EVENING  Moderate asthma without complication, unspecified whether persistent -     levocetirizine (XYZAL) 5 MG tablet; TAKE 1 TABLET(5 MG) BY MOUTH EVERY EVENING -     albuterol (ACCUNEB) 1.25 MG/3ML nebulizer solution; Take 3 mLs (1.25 mg total) by nebulization every 6 (six) hours as needed for wheezing. -     albuterol (PROVENTIL HFA;VENTOLIN HFA) 108 (90 Base) MCG/ACT inhaler; INHALE 2 PUFFS INTO THE LUNGS EVERY 4 HOURS AS NEEDED FOR WHEEZING OR SHORTNESS OF BREATH OR COUGH  Gastroesophageal reflux disease, esophagitis presence not specified -     pantoprazole (PROTONIX) 40 MG tablet; Take 1 tablet (40 mg total) by mouth daily.    The patient is advised to call or return to clinic if she does not see an improvement in symptoms, or to seek the care of the closest emergency department if she worsens with the above plan.   Deliah Boston, MHS, [name] Primary Care at [facility] 08/18/2017 12:14 PM

## 2017-08-16 ENCOUNTER — Telehealth: Payer: Self-pay | Admitting: Family Medicine

## 2017-08-16 MED ORDER — MONTELUKAST SODIUM 10 MG PO TABS: ORAL_TABLET | ORAL | 0 refills | Status: DC

## 2017-08-16 NOTE — Telephone Encounter (Signed)
Copied from CRM [id number]. Topic: Quick Communication - Rx Refill/Question >> Aug 16, 2017  5:33 PM Mcneil, Ja-Kwan wrote: Medication: montelukast (SINGULAIR) 10 MG tablet  Preferred Pharmacy (with phone number or street name): [facility] [address] [place], [place] - [address] AT [facility] [address] [place] [address] Phone: [phone] Fax: [phone]  Agent: Please be advised that RX refills may take up to 3 business days. We ask that you follow-up with your pharmacy.

## 2017-08-23 ENCOUNTER — Encounter: Payer: Self-pay | Admitting: Family Medicine

## 2017-08-28 ENCOUNTER — Encounter: Payer: Self-pay | Admitting: Family Medicine

## 2017-11-22 ENCOUNTER — Encounter: Payer: Self-pay | Admitting: Physician Assistant

## 2017-11-22 ENCOUNTER — Ambulatory Visit: Payer: BC Managed Care – PPO | Admitting: Physician Assistant

## 2017-11-22 ENCOUNTER — Other Ambulatory Visit: Payer: Self-pay

## 2017-11-22 VITALS — BP 129/80 | HR 80 | Temp 98.7°F | Resp 16 | Ht 67.0 in | Wt 212.2 lb

## 2017-11-22 DIAGNOSIS — J069 Acute upper respiratory infection, unspecified: Secondary | ICD-10-CM | POA: Diagnosis not present

## 2017-11-22 MED ORDER — HYDROCODONE-HOMATROPINE 5-1.5 MG/5ML PO SYRP: 2.5000 mL | ORAL_SOLUTION | Freq: Every day | ORAL | 0 refills | Status: AC

## 2017-11-22 MED ORDER — SULFAMETHOXAZOLE-TRIMETHOPRIM 800-160 MG PO TABS: 1.0000 | ORAL_TABLET | Freq: Two times a day (BID) | ORAL | 0 refills | Status: DC

## 2017-11-22 NOTE — Patient Instructions (Signed)
° ° ° °  If you have lab work done today you will be contacted with your lab results within the next 2 weeks.  If you have not heard from us then please contact us. The fastest way to get your results is to register for My Chart. ° ° °IF you received an x-ray today, you will receive an invoice from [facility]. Please contact  Radiology at [phone] with questions or concerns regarding your invoice.  ° °IF you received labwork today, you will receive an invoice from [facility]. Please contact [facility] at [phone] with questions or concerns regarding your invoice.  ° °Our billing staff will not be able to assist you with questions regarding bills from these companies. ° °You will be contacted with the lab results as soon as they are available. The fastest way to get your results is to activate your My Chart account. Instructions are located on the last page of this paperwork. If you have not heard from us regarding the results in 2 weeks, please contact this office. °  ° ° ° °

## 2017-11-22 NOTE — Progress Notes (Signed)
11/22/2017 2:30 PM   DOB: 10/04/1985 / MRN: [id number]  SUBJECTIVE:  Sandy Baker is a 32 y.o. female presenting for cough, nasal congestion, sore throat.  Denies fever.  Sh is eating well. Taking OTC analgesics with some relief. Denies chest pain, SOB, new DOE, orthopnea, leg swelling.    She is allergic to aminoglycosides; amoxicillin; beta adrenergic blockers; bioflavonoids; macrolides and ketolides; magnesium; mometasone furo-formoterol fum; onabotulinumtoxina; penicillamine; penicillins; povidone-iodine; procainamide; quinolones; relpax [eletriptan]; sumatriptan; tubocurarine; botox [botulinum toxin type a]; iodine; latex; quinine derivatives; saline; sodium chloride; and zithromax [azithromycin].   She  has a past medical history of Asthma, Autoimmune disease (HCC), Chiari I malformation (HCC), Fibromyalgia, Migraines, Myasthenia gravis (HCC), Sleep apnea, and TMJ (dislocation of temporomandibular joint).    She  reports that she has never smoked. She has never used smokeless tobacco. She reports that she does not drink alcohol or use drugs. She  reports that she does not currently engage in sexual activity. She reports using the following method of birth control/protection: Condom. The patient  has a past surgical history that includes Thymectomy.  Her family history includes Asthma in her mother and sister; Bipolar disorder in her mother; Depression in her mother; Hypertension in her mother.  ROS Per HPI  The problem list and medications were reviewed and updated by myself where necessary and exist elsewhere in the encounter.   OBJECTIVE:  BP 129/80   Pulse 80   Temp 98.7 F (37.1 C)   Resp 16   Ht 5\' 7"  (1.702 m)   Wt 212 lb 3.2 oz (96.3 kg)   SpO2 98%   BMI 33.24 kg/m   Wt Readings from Last 3 Encounters:  11/22/17 212 lb 3.2 oz (96.3 kg)  08/14/17 213 lb 9.6 oz (96.9 kg)  04/13/17 209 lb 6.4 oz (95 kg)   Temp Readings from Last 3 Encounters:  11/22/17 98.7 F  (37.1 C)  08/14/17 99.2 F (37.3 C) (Oral)  04/13/17 98.5 F (36.9 C) (Oral)   BP Readings from Last 3 Encounters:  11/22/17 129/80  08/14/17 118/74  04/13/17 110/66   Pulse Readings from Last 3 Encounters:  11/22/17 80  08/14/17 (!) 103  04/13/17 99    Physical Exam  Constitutional: She is oriented to person, place, and time. She appears well-nourished.  Non-toxic appearance. No distress.  HENT:  Right Ear: External ear normal.  Left Ear: External ear normal.  Nose: Mucosal edema present. Right sinus exhibits no maxillary sinus tenderness and no frontal sinus tenderness. Left sinus exhibits no maxillary sinus tenderness and no frontal sinus tenderness.  Mouth/Throat: Oropharynx is clear and moist. No oropharyngeal exudate.  Eyes: Pupils are equal, round, and reactive to light. Conjunctivae and EOM are normal.  Cardiovascular: Normal rate, regular rhythm, S1 normal, S2 normal, normal heart sounds and intact distal pulses. Exam reveals no gallop, no friction rub and no decreased pulses.  No murmur heard. Pulmonary/Chest: Effort normal and breath sounds normal. No stridor. No respiratory distress. She has no wheezes. She has no rales.  Abdominal: She exhibits no distension.  Musculoskeletal: She exhibits no edema.  Neurological: She is alert and oriented to person, place, and time. No cranial nerve deficit. Gait normal.  Skin: Skin is warm and dry. No rash noted. She is not diaphoretic. No erythema. No pallor.  Psychiatric: She has a normal mood and affect. Her behavior is normal.  Vitals reviewed.   No results found for: HGBA1C  Lab Results  Component Value  Date   WBC 11.0 (H) 12/28/2016   HGB 10.5 (L) 12/28/2016   HCT 36.7 12/28/2016   MCV 81.7 12/28/2016   PLT 287 12/28/2016    Lab Results  Component Value Date   CREATININE 0.52 12/28/2015   BUN 7 12/28/2015   NA 139 12/28/2015   K 4.0 12/28/2015   CL 99 12/28/2015   CO2 33 (H) 12/28/2015    Lab Results    Component Value Date   ALT 14 12/28/2015   AST 17 12/28/2015   ALKPHOS 38 12/28/2015   BILITOT 0.4 12/28/2015    Lab Results  Component Value Date   TSH 0.85 12/28/2016    No results found for: CHOL, HDL, LDLCALC, LDLDIRECT, TRIG, CHOLHDL   ASSESSMENT AND PLAN:  [name] was seen today for cough.  Diagnoses and all orders for this visit:  URI with cough and congestion -     HYDROcodone-homatropine (HYCODAN) 5-1.5 MG/5ML syrup; Take 2.5-5 mLs by mouth at bedtime for 5 days. -     sulfamethoxazole-trimethoprim (BACTRIM DS,SEPTRA DS) 800-160 MG tablet; Take 1 tablet by mouth 2 (two) times daily.    The patient is advised to call or return to clinic if she does not see an improvement in symptoms, or to seek the care of the closest emergency department if she worsens with the above plan.   Deliah BostonMichael Clark, MHS, [name] Primary Care at [facility] 11/22/2017 2:30 PM

## 2017-11-29 ENCOUNTER — Ambulatory Visit: Payer: BC Managed Care – PPO | Admitting: Gynecology

## 2017-11-29 ENCOUNTER — Encounter: Payer: Self-pay | Admitting: Gynecology

## 2017-11-29 VITALS — BP 124/80 | Ht 68.0 in | Wt 212.0 lb

## 2017-11-29 DIAGNOSIS — Z01419 Encounter for gynecological examination (general) (routine) without abnormal findings: Secondary | ICD-10-CM

## 2017-11-29 NOTE — Patient Instructions (Signed)
Follow-up in 1 year, sooner if any issues 

## 2017-11-29 NOTE — Addendum Note (Signed)
Addended by: Dayna BarkerGARDNER, Casi Westerfeld K on: 11/29/2017 11:07 AM   Modules accepted: Orders

## 2017-11-29 NOTE — Progress Notes (Signed)
    Sandy MeyerJasmine N Baker 09/09/1985 [id number]        32 y.o.  G0P0 for annual gynecologic exam.  Without gynecologic complaints.  Past medical history,surgical history, problem list, medications, allergies, family history and social history were all reviewed and documented as reviewed in the EPIC chart.  ROS:  Performed with pertinent positives and negatives included in the history, assessment and plan.   Additional significant findings : None   Exam: Sandy PortelaKim Baker assistant Vitals:   11/29/17 0901  BP: 124/80  Weight: 212 lb (96.2 kg)  Height: 5\' 8"  (1.727 m)   Body mass index is 32.23 kg/m.  General appearance:  Normal affect, orientation and appearance. Skin: Grossly normal HEENT: Without gross lesions.  No cervical or supraclavicular adenopathy. Thyroid normal.  Lungs:  Clear without wheezing, rales or rhonchi Cardiac: RR, without RMG Abdominal:  Soft, nontender, without masses, guarding, rebound, organomegaly or hernia Breasts:  Examined lying and sitting without masses, retractions, discharge or axillary adenopathy. Pelvic:  Ext, BUS, Vagina: Normal  Cervix: Normal.  Pap smear done  Uterus: Anteverted, normal size, shape and contour, midline and mobile nontender   Adnexa: Without masses or tenderness    Anus and perineum: Normal   Rectovaginal: Normal sphincter tone without palpated masses or tenderness.    Assessment/Plan:  32 y.o. G0P0 female for annual gynecologic exam with regular menses, not sexually active.   1. Contraception.  Patient not sexually active.  Declines contraception.  She did ask about tubal sterilization noting that she never wanted children.  I reviewed the issues of regret and alternatives which I encouraged such as Mirena IUD.  We reviewed not only contraception but menstrual suppression as a benefit. 2. Breast health.  Breast exam normal today.  We will plan mammography closer to 42. 3. Pap smear 2017.  Pap smear done today.  No history of abnormal  Pap smears. 4. Health maintenance.  No routine lab work done as patient does this elsewhere.  Follow-up 1 year, sooner as needed.   Dara Lordsimothy P Nayah Lukens MD, 9:24 AM 11/29/2017

## 2017-12-02 LAB — PAP IG W/ RFLX HPV ASCU

## 2018-01-22 ENCOUNTER — Other Ambulatory Visit: Payer: Self-pay

## 2018-01-22 ENCOUNTER — Ambulatory Visit: Payer: BC Managed Care – PPO | Admitting: Family Medicine

## 2018-01-22 ENCOUNTER — Encounter: Payer: Self-pay | Admitting: Family Medicine

## 2018-01-22 ENCOUNTER — Ambulatory Visit (INDEPENDENT_AMBULATORY_CARE_PROVIDER_SITE_OTHER): Payer: BC Managed Care – PPO

## 2018-01-22 VITALS — BP 151/100 | HR 87 | Temp 98.3°F | Resp 16 | Ht 68.0 in | Wt 214.4 lb

## 2018-01-22 DIAGNOSIS — M545 Low back pain, unspecified: Secondary | ICD-10-CM

## 2018-01-22 DIAGNOSIS — G7000 Myasthenia gravis without (acute) exacerbation: Secondary | ICD-10-CM | POA: Diagnosis not present

## 2018-01-22 LAB — POCT URINE PREGNANCY: PREG TEST UR: NEGATIVE

## 2018-01-22 NOTE — Patient Instructions (Addendum)
CLINICAL DATA:  32 year old female with low back and tailbone pain. No known injury.  EXAM: SACRUM AND COCCYX - 2+ VIEW  COMPARISON:  Skeletal survey 06/25/2007.  FINDINGS: Bone mineralization is within normal limits. Sacral ala and SI joints appear stable and normal. Visible pelvis is intact. On the lateral view the sacral and coccygeal segments appear within normal limits. Stable and negative visible lower lumbar levels. Chronic right greater than left hemipelvis phleboliths.  IMPRESSION: Negative.   Electronically Signed   By: Odessa Fleming M.D.   On: 01/22/2018 15:20     If you have lab work done today you will be contacted with your lab results within the next 2 weeks.  If you have not heard from [place] then please contact us. The fastest way to get your results is to register for My Chart.   IF you received an x-ray today, you will receive an invoice from [facility]. Please contact [facility] at [phone] with questions or concerns regarding your invoice.   IF you received labwork today, you will receive an invoice from [facility]. Please contact [facility] at [phone] with questions or concerns regarding your invoice.   Our billing staff will not be able to assist you with questions regarding bills from these companies.  You will be contacted with the lab results as soon as they are available. The fastest way to get your results is to activate your My Chart account. Instructions are located on the last page of this paperwork. If you have not heard from [place] regarding the results in 2 weeks, please contact this office.     Back Pain, Adult Many adults have back pain from time to time. Common causes of back pain include:  A strained muscle or ligament.  Wear and tear (degeneration) of the spinal disks.  Arthritis.  A hit to the back.  Back pain can be short-lived (acute) or last a long time (chronic). A physical exam, lab tests, and imaging  studies may be done to find the cause of your pain. Follow these instructions at home: Managing pain and stiffness  Take over-the-counter and prescription medicines only as told by your health care provider.  If directed, apply heat to the affected area as often as told by your health care provider. Use the heat source that your health care provider recommends, such as a moist heat pack or a heating pad. ? Place a towel between your skin and the heat source. ? Leave the heat on for 20-30 minutes. ? Remove the heat if your skin turns bright red. This is especially important if you are unable to feel pain, heat, or cold. You have a greater risk of getting burned.  If directed, apply ice to the injured area: ? Put ice in a plastic bag. ? Place a towel between your skin and the bag. ? Leave the ice on for 20 minutes, 2-3 times a day for the first 2-3 days. Activity  Do not stay in bed. Resting more than 1-2 days can delay your recovery.  Take short walks on even surfaces as soon as you are able. Try to increase the length of time you walk each day.  Do not sit, drive, or stand in one place for more than 30 minutes at a time. Sitting or standing for long periods of time can put stress on your back.  Use proper lifting techniques. When you bend and lift, use positions that put less stress on your back: ? [place] your  knees. ? Keep the load close to your body. ? Avoid twisting.  Exercise regularly as told by your health care provider. Exercising will help your back heal faster. This also helps prevent back injuries by keeping muscles strong and flexible.  Your health care provider may recommend that you see a physical therapist. This person can help you come up with a safe exercise program. Do any exercises as told by your physical therapist. Lifestyle  Maintain a healthy weight. Extra weight puts stress on your back and makes it difficult to have good posture.  Avoid activities or  situations that make you feel anxious or stressed. Learn ways to manage anxiety and stress. One way to manage stress is through exercise. Stress and anxiety increase muscle tension and can make back pain worse. General instructions  Sleep on a firm mattress in a comfortable position. Try lying on your side with your knees slightly bent. If you lie on your back, put a pillow under your knees.  Follow your treatment plan as told by your health care provider. This may include: ? Cognitive or behavioral therapy. ? Acupuncture or massage therapy. ? Meditation or yoga. Contact a health care provider if:  You have pain that is not relieved with rest or medicine.  You have increasing pain going down into your legs or buttocks.  Your pain does not improve in 2 weeks.  You have pain at night.  You lose weight.  You have a fever or chills. Get help right away if:  You develop new bowel or bladder control problems.  You have unusual weakness or numbness in your arms or legs.  You develop nausea or vomiting.  You develop abdominal pain.  You feel faint. Summary  Many adults have back pain from time to time. A physical exam, lab tests, and imaging studies may be done to find the cause of your pain.  Use proper lifting techniques. When you bend and lift, use positions that put less stress on your back.  Take over-the-counter and prescription medicines and apply heat or ice as directed by your health care provider. This information is not intended to replace advice given to you by your health care provider. Make sure you discuss any questions you have with your health care provider. Document Released: 03/26/2005 Document Revised: 04/30/2016 Document Reviewed: 04/30/2016 Elsevier Interactive Patient Education  [employer].

## 2018-01-22 NOTE — Progress Notes (Signed)
Chief Complaint  Patient presents with  . Back Pain    onset: 01/05/18, not taking any otc pain meds for pain, asked about pain level and to give a number between 0-10 no response but that she has a high pain tolerance, no dysuria    HPI  This is a 32yo female with a history of low back pain that started 01/05/2018 She started having low back pain if she crosses her legs She has Myasthenia Gravis She felt the pain when her neurologist pushed down on her arms but not carrying a back pack The back pain affects her when going down steps She has difficulty rolling over in bed and has to hold on to the mattress to get out of bed She reports that the pain is tolerable  She came in because her back pain this morning while brushing her teeth was 7 or 8/10    Past Medical History:  Diagnosis Date  . Asthma   . Autoimmune disease (HCC)   . Chiari I malformation (HCC)   . Fibromyalgia   . Migraines   . Myasthenia gravis (HCC)   . Sleep apnea   . TMJ (dislocation of temporomandibular joint)     Current Outpatient Medications  Medication Sig Dispense Refill  . albuterol (ACCUNEB) 1.25 MG/3ML nebulizer solution Take 3 mLs (1.25 mg total) by nebulization every 6 (six) hours as needed for wheezing. 75 mL 12  . albuterol (PROVENTIL HFA;VENTOLIN HFA) 108 (90 Base) MCG/ACT inhaler INHALE 2 PUFFS INTO THE LUNGS EVERY 4 HOURS AS NEEDED FOR WHEEZING OR SHORTNESS OF BREATH OR COUGH 18 g 0  . benzonatate (TESSALON) 100 MG capsule TAKE 1 TO 2 CAPSULES(100 TO 200 MG) BY MOUTH THREE TIMES DAILY AS NEEDED FOR COUGH 90 capsule 0  . butalbital-aspirin-caffeine (FIORINAL) 50-325-40 MG per tablet Take 1 tablet by mouth 2 (two) times daily as needed for headache.    . candesartan (ATACAND) 8 MG tablet Take 8 mg by mouth daily.    Marland Kitchen ibuprofen (ADVIL,MOTRIN) 800 MG tablet     . levocetirizine (XYZAL) 5 MG tablet TAKE 1 TABLET(5 MG) BY MOUTH EVERY EVENING 90 tablet 3  . meloxicam (MOBIC) 15 MG tablet Take 15 mg  by mouth daily.    . montelukast (SINGULAIR) 10 MG tablet TAKE 1 TABLET(10 MG) BY MOUTH AT BEDTIME 90 tablet 0  . mycophenolate (CELLCEPT) 250 MG capsule     . mycophenolate (CELLCEPT) 500 MG tablet Take by mouth 2 (two) times daily.    . pantoprazole (PROTONIX) 40 MG tablet Take 1 tablet (40 mg total) by mouth daily. 90 tablet 3  . predniSONE (DELTASONE) 10 MG tablet Take 20 mg by mouth daily.     . ranitidine (ZANTAC) 150 MG tablet TAKE 1 TABLET(150 MG) BY MOUTH TWICE DAILY 60 tablet 0  . verapamil (VERELAN PM) 240 MG 24 hr capsule Take 240 mg by mouth at bedtime.    Marland Kitchen zonisamide (ZONEGRAN) 100 MG capsule Take 1 capsule (100 mg total) by mouth daily. (Patient taking differently: Take 200 mg by mouth daily. )    . promethazine-codeine (PHENERGAN WITH CODEINE) 6.25-10 MG/5ML syrup Take 5 mLs by mouth every 6 (six) hours as needed for cough. 160 mL 0  . sulfamethoxazole-trimethoprim (BACTRIM DS,SEPTRA DS) 800-160 MG tablet Take 1 tablet by mouth 2 (two) times daily. 20 tablet 0   No current facility-administered medications for this visit.     Allergies:  Allergies  Allergen Reactions  . Aminoglycosides Other (See  Comments)    Myasthenia Gravis alert Myasthenia Gravis alert   . Amoxicillin Swelling  . Beta Adrenergic Blockers Other (See Comments)    Myasthenia Gravis alert Myasthenia Gravis alert   . Bioflavonoids Hives  . Macrolides And Ketolides Other (See Comments)    Myasthenia Gravis alert Myasthenia Gravis alert   . Magnesium Anaphylaxis and Other (See Comments)    Myasthenia Gravis alert Myasthenia Gravis alert   . Mometasone Furo-Formoterol Fum Swelling    Patient tongue swells Patient tongue swells   . Onabotulinumtoxina Other (See Comments)    Myasthenia Gravis alert Myasthenia Gravis alert   . Penicillamine Other (See Comments)    Myasthenia Gravis alert  . Penicillins Swelling  . Povidone-Iodine Hives  . Procainamide Other (See Comments)    Myasthenia  Gravis alert Myasthenia Gravis alert   . Quinolones Other (See Comments)    Myasthenia Gravis alert Myasthenia Gravis alert   . Relpax [Eletriptan] Swelling  . Sumatriptan Anaphylaxis    Other Reaction: throat closed  . Tubocurarine Other (See Comments)    Myasthenia Gravis alert Myasthenia Gravis alert   . Botox [Botulinum Toxin Type A]   . Iodine Hives  . Latex Hives  . Quinine Derivatives Swelling  . Saline     Metal sensation in mouth  . Sodium Chloride   . Zithromax [Azithromycin] Swelling    Past Surgical History:  Procedure Laterality Date  . CARDIAC CATHETERIZATION     05-2017  . THYMECTOMY      Social History   Socioeconomic History  . Marital status: Single    Spouse name: Not on file  . Number of children: Not on file  . Years of education: BA  . Highest education level: Not on file  Occupational History  . Occupation: [employer]   . Occupation: [employer]  Social Needs  . Financial resource strain: Not on file  . Food insecurity:    Worry: Not on file    Inability: Not on file  . Transportation needs:    Medical: Not on file    Non-medical: Not on file  Tobacco Use  . Smoking status: Never Smoker  . Smokeless tobacco: Never Used  Substance and Sexual Activity  . Alcohol use: No    Alcohol/week: 0.0 standard drinks  . Drug use: No  . Sexual activity: Not Currently    Birth control/protection: Condom    Comment: 1st intercourse 32 yo-Fewer than 5 partners  Lifestyle  . Physical activity:    Days per week: Not on file    Minutes per session: Not on file  . Stress: Not on file  Relationships  . Social connections:    Talks on phone: Not on file    Gets together: Not on file    Attends religious service: Not on file    Active member of club or organization: Not on file    Attends meetings of clubs or organizations: Not on file    Relationship status: Not on file  Other Topics Concern  . 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 Hypertension Mother   . Depression Mother   . Asthma Mother   . Bipolar disorder Mother   . Asthma Sister      ROS Review of Systems See HPI Constitution: No fevers or chills No malaise No diaphoresis Skin: No rash or itching Eyes: no blurry vision,  no double vision GU: no dysuria or hematuria Neuro: no dizziness or headaches all others reviewed and negative   Objective: Vitals:   01/22/18 1405  BP: (!) 151/100  Pulse: 87  Resp: 16  Temp: 98.3 F (36.8 C)  TempSrc: Oral  SpO2: 95%  Weight: 214 lb 6.4 oz (97.3 kg)  Height: 5\' 8"  (1.727 m)    Physical Exam  Physical Exam  Constitutional: Oriented to person, place, and time. Appears well-developed and well-nourished.  HENT:  Head: Normocephalic and atraumatic.  Eyes: Conjunctivae and EOM are normal.  Cardiovascular: Normal rate, regular rhythm, normal heart sounds and intact distal pulses.  No murmur heard. Pulmonary/Chest: Effort normal and breath sounds normal. No stridor. No respiratory distress. Has no wheezes.  Neurological: Is alert and oriented to person, place, and time.  Skin: Skin is warm. Capillary refill takes less than 2 seconds.  Psychiatric: Has a normal mood and affect. Behavior is normal. Judgment and thought content normal.    CLINICAL DATA:  32 year old female with low back and tailbone pain. No known injury.  EXAM: SACRUM AND COCCYX - 2+ VIEW  COMPARISON:  Skeletal survey 06/25/2007.  FINDINGS: Bone mineralization is within normal limits. Sacral ala and SI joints appear stable and normal. Visible pelvis is intact. On the lateral view the sacral and coccygeal segments appear within normal limits. Stable and negative visible lower lumbar levels. Chronic right greater than left hemipelvis phleboliths.  IMPRESSION: Negative.   Electronically Signed   By: Odessa Fleming M.D.   On: 01/22/2018  15:20    Assessment and Plan Tylie was seen today for back pain.  Diagnoses and all orders for this visit:  Acute midline low back pain without sciatica- negative for fracture  -     DG Sacrum/Coccyx; Future -     POCT urine pregnancy  Myasthenia gravis (HCC) -  Advised pt to follow up with her Rheumatologist    Add Dinapoli A Creta Levin

## 2018-02-25 ENCOUNTER — Telehealth: Payer: Self-pay | Admitting: *Deleted

## 2018-02-25 NOTE — Telephone Encounter (Signed)
Pt called to ask if I could fill out an insurance form. I asked what information was needed for documentation in the form. The patient was unsure. I advised to send me the form and I will review and fill it out or call her back if inappropriate for me to fill out. I advised I would call her back once I receive the form. KW CMA

## 2018-02-26 NOTE — Telephone Encounter (Signed)
Form is from [employer] for [employer]it Form. I filled out the portion for provider which documented last date of gynecological exam and pap smear. Which was 11/29/2017. No signature was required.  Form sent back to patient. KW CMA

## 2018-03-29 ENCOUNTER — Ambulatory Visit: Payer: BC Managed Care – PPO | Admitting: Family Medicine

## 2018-03-29 ENCOUNTER — Encounter: Payer: Self-pay | Admitting: Family Medicine

## 2018-03-29 ENCOUNTER — Other Ambulatory Visit: Payer: Self-pay

## 2018-03-29 VITALS — BP 125/84 | HR 82 | Ht 68.0 in | Wt 208.4 lb

## 2018-03-29 DIAGNOSIS — D899 Disorder involving the immune mechanism, unspecified: Secondary | ICD-10-CM | POA: Diagnosis not present

## 2018-03-29 DIAGNOSIS — J069 Acute upper respiratory infection, unspecified: Secondary | ICD-10-CM

## 2018-03-29 DIAGNOSIS — D849 Immunodeficiency, unspecified: Secondary | ICD-10-CM

## 2018-03-29 MED ORDER — SULFAMETHOXAZOLE-TRIMETHOPRIM 800-160 MG PO TABS
1.0000 | ORAL_TABLET | Freq: Two times a day (BID) | ORAL | 0 refills | Status: DC
Start: 2018-03-29 — End: 2018-11-21

## 2018-03-29 MED ORDER — PROMETHAZINE-CODEINE 6.25-10 MG/5ML PO SYRP: 5.0000 mL | ORAL_SOLUTION | Freq: Four times a day (QID) | ORAL | 0 refills | Status: DC | PRN

## 2018-03-29 NOTE — Progress Notes (Signed)
Patient ID: Sandy Baker, female    DOB: 02/07/1986, 32 y.o.   MRN: [id number]  PCP: Doristine BosworthStallings, [name], MD  Chief Complaint  Patient presents with  . Cough    sore throat for the past 2 day, denies fever or chills. Taking no meds for the symptoms. Does not want to be swabbed for flu.    Subjective:  HPI Sandy Baker is a 32 y.o. female presents for evaluation sore throat x 2 days. She is immunocompromised, suffers from Myasthenia Gravis and received last immunotherapy treatment over 1 week ago. She endorses sick contacts. No fever. Endorses chills and fatigue. Persistent non-productive cough. She will be traveling out of state and is concern for worsening of symptoms is requesting antibiotic.  Social History   Socioeconomic History  . Marital status: Single    Spouse name: Not on file  . Number of children: Not on file  . Years of education: BA  . Highest education level: Not on file  Occupational History  . Occupation: [employer]   . Occupation: [employer]  Social Needs  . Financial resource strain: Not on file  . Food insecurity:    Worry: Not on file    Inability: Not on file  . Transportation needs:    Medical: Not on file    Non-medical: Not on file  Tobacco Use  . Smoking status: Never Smoker  . Smokeless tobacco: Never Used  Substance and Sexual Activity  . Alcohol use: No    Alcohol/week: 0.0 standard drinks  . Drug use: No  . Sexual activity: Not Currently    Birth control/protection: Condom    Comment: 1st intercourse 32 yo-Fewer than 5 partners  Lifestyle  . Physical activity:    Days per week: Not on file    Minutes per session: Not on file  . Stress: Not on file  Relationships  . Social connections:    Talks on phone: Not on file    Gets together: Not on file    Attends religious service: Not on file    Active member of club or organization: Not on file    Attends meetings of clubs or organizations: Not on file    Relationship  status: Not on file  . Intimate partner violence:    Fear of current or ex partner: Not on file    Emotionally abused: Not on file    Physically abused: Not on file    Forced sexual activity: Not on file  Other Topics Concern  . 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 Hypertension Mother   . Depression Mother   . Asthma Mother   . Bipolar disorder Mother   . Asthma Sister    Review of Systems Pertinent negatives listed in HPI Patient Active Problem List   Diagnosis Date Noted  . Tachycardia 01/20/2016  . 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 Dysarthria 12/26/2015  . Migraine without aura and without status migrainosus, not intractable 12/21/2015  . Obstructive sleep apnea syndrome 11/05/2014  . GERD (gastroesophageal reflux disease) 09/12/2013  . Asthma without status asthmaticus 09/09/2013  . Fibromyalgia 09/09/2013  . Allergic rhinitis 09/09/2013  . Myasthenia gravis (HCC) 06/19/2011    Allergies  Allergen Reactions  . Aminoglycosides Other (See Comments)    Myasthenia Gravis alert Myasthenia Gravis alert   . Amoxicillin Swelling  . Beta Adrenergic Blockers Other (See  Comments)    Myasthenia Gravis alert Myasthenia Gravis alert   . Bioflavonoids Hives  . Macrolides And Ketolides Other (See Comments)    Myasthenia Gravis alert Myasthenia Gravis alert   . Magnesium Anaphylaxis and Other (See Comments)    Myasthenia Gravis alert Myasthenia Gravis alert   . Mometasone Furo-Formoterol Fum Swelling    Patient tongue swells Patient tongue swells   . Onabotulinumtoxina Other (See Comments)    Myasthenia Gravis alert Myasthenia Gravis alert   . Penicillamine Other (See Comments)    Myasthenia Gravis alert  . Penicillins Swelling  . Povidone-Iodine Hives  . Procainamide Other (See Comments)    Myasthenia Gravis alert Myasthenia Gravis alert   . Quinolones Other (See Comments)     Myasthenia Gravis alert Myasthenia Gravis alert   . Relpax [Eletriptan] Swelling  . Sumatriptan Anaphylaxis    Other Reaction: throat closed  . Tubocurarine Other (See Comments)    Myasthenia Gravis alert Myasthenia Gravis alert   . Botox [Botulinum Toxin Type A]   . Iodine Hives  . Latex Hives  . Quinine Derivatives Swelling  . Saline     Metal sensation in mouth  . Sodium Chloride   . Zithromax [Azithromycin] Swelling    Prior to Admission medications   Medication Sig Start Date End Date Taking? Authorizing Provider  albuterol (ACCUNEB) 1.25 MG/3ML nebulizer solution Take 3 mLs (1.25 mg total) by nebulization every 6 (six) hours as needed for wheezing. 08/14/17  Yes [name], Michael L, [name]  albuterol (PROVENTIL HFA;VENTOLIN HFA) 108 (90 Base) MCG/ACT inhaler INHALE 2 PUFFS INTO THE LUNGS EVERY 4 HOURS AS NEEDED FOR WHEEZING OR SHORTNESS OF BREATH OR COUGH 08/14/17  Yes [name], Michael L, [name]  benzonatate (TESSALON) 100 MG capsule TAKE 1 TO 2 CAPSULES(100 TO 200 MG) BY MOUTH THREE TIMES DAILY AS NEEDED FOR COUGH 04/03/17  Yes Wallis BambergMani, [name], [name]  butalbital-aspirin-caffeine [facility](FIORINAL) 50-325-40 MG per tablet Take 1 tablet by mouth 2 (two) times daily as needed for headache.   Yes [provider]  candesartan (ATACAND) 8 MG tablet Take 8 mg by mouth daily.   Yes [provider]  ibuprofen (ADVIL,MOTRIN) 800 MG tablet  04/03/17  Yes [provider]  levocetirizine (XYZAL) 5 MG tablet TAKE 1 TABLET(5 MG) BY MOUTH EVERY EVENING 08/14/17  Yes [name], Michael L, [name]  meloxicam (MOBIC) 15 MG tablet Take 15 mg by mouth daily.   Yes [provider]  montelukast (SINGULAIR) 10 MG tablet TAKE 1 TABLET(10 MG) BY MOUTH AT BEDTIME 08/16/17  Yes Collie SiadStallings, [name], MD  mycophenolate (CELLCEPT) 250 MG capsule  08/19/14  Yes [provider]  mycophenolate (CELLCEPT) 500 MG tablet Take by mouth 2 (two) times daily.   Yes [provider]  pantoprazole  (PROTONIX) 40 MG tablet Take 1 tablet (40 mg total) by mouth daily. 08/14/17  Yes [name], Michael L, [name]  predniSONE (DELTASONE) 10 MG tablet Take 20 mg by mouth daily.    Yes [provider]  ranitidine (ZANTAC) 150 MG tablet TAKE 1 TABLET(150 MG) BY MOUTH TWICE DAILY 11/28/16  Yes English, Stephanie D, PA  verapamil (VERELAN PM) 240 MG 24 hr capsule Take 240 mg by mouth at bedtime.   Yes [provider]  zonisamide (ZONEGRAN) 100 MG capsule Take 1 capsule (100 mg total) by mouth daily. Patient taking differently: Take 200 mg by mouth daily.  10/09/16  Yes Garnetta BuddyEnglish, Stephanie D, PA   Past Medical, Surgical Family and Social History reviewed and  updated.   Objective:   Today's Vitals   03/29/18 1205  BP: 125/84  Pulse: 82  SpO2: 97%  Weight: 208 lb 6.4 oz (94.5 kg)  Height: 5\' 8"  (1.727 m)    Wt Readings from Last 3 Encounters:  03/29/18 208 lb 6.4 oz (94.5 kg)  01/22/18 214 lb 6.4 oz (97.3 kg)  11/29/17 212 lb (96.2 kg)   Physical Exam General appearance: alert, well developed, well nourished, cooperative and in no distress Head: Normocephalic, without obvious abnormality, atraumatic Respiratory: Respirations even and unlabored, normal respiratory rate Heart: rate and rhythm normal. No gallop or murmurs noted on exam  Extremities: No gross deformities Skin: Skin color, texture, turgor normal. No rashes seen  Psych: Appropriate mood and affect. Neurologic: Mental status: Alert, oriented to person, place, and time, thought content appropriate.   Assessment & Plan:  1. Acute upper respiratory infection 2. Immunocompromised (HCC) Will trial antibiotic therapy.   For cough take promethazine-codeine. Meds ordered this encounter  Medications  . sulfamethoxazole-trimethoprim (BACTRIM DS,SEPTRA DS) 800-160 MG tablet    Sig: Take 1 tablet by mouth 2 (two) times daily.    Dispense:  20 tablet    Refill:  0  . promethazine-codeine (PHENERGAN WITH CODEINE) 6.25-10 MG/5ML  syrup    Sig: Take 5 mLs by mouth every 6 (six) hours as needed for cough.    Dispense:  160 mL    Refill:  0    -The patient was given clear instructions to go to ER or return to medical center if symptoms do not improve, worsen or new problems develop. The patient verbalized understanding.     Godfrey Pick. Tiburcio Pea, FNP-C Nurse Practitioner (PRN Staff)  [facility] [address] [place], [place]  [phone]

## 2018-03-29 NOTE — Patient Instructions (Signed)
° ° ° °  If you have lab work done today you will be contacted with your lab results within the next 2 weeks.  If you have not heard from us then please contact us. The fastest way to get your results is to register for My Chart. ° ° °IF you received an x-ray today, you will receive an invoice from [facility]. Please contact [facility] at [phone] with questions or concerns regarding your invoice.  ° °IF you received labwork today, you will receive an invoice from [facility]. Please contact [facility] at [phone] with questions or concerns regarding your invoice.  ° °Our billing staff will not be able to assist you with questions regarding bills from these companies. ° °You will be contacted with the lab results as soon as they are available. The fastest way to get your results is to activate your My Chart account. Instructions are located on the last page of this paperwork. If you have not heard from us regarding the results in 2 weeks, please contact this office. °  ° ° ° °

## 2018-07-06 ENCOUNTER — Other Ambulatory Visit: Payer: Self-pay | Admitting: Family Medicine

## 2018-07-06 ENCOUNTER — Other Ambulatory Visit: Payer: Self-pay | Admitting: Physician Assistant

## 2018-07-06 DIAGNOSIS — J45909 Unspecified asthma, uncomplicated: Secondary | ICD-10-CM

## 2018-08-13 ENCOUNTER — Telehealth: Payer: Self-pay | Admitting: Family Medicine

## 2018-08-13 ENCOUNTER — Other Ambulatory Visit: Payer: Self-pay | Admitting: Family Medicine

## 2018-08-13 DIAGNOSIS — J45909 Unspecified asthma, uncomplicated: Secondary | ICD-10-CM

## 2018-08-15 NOTE — Telephone Encounter (Signed)
[phone] Pt would like to know why refill was declined Please call

## 2018-08-21 NOTE — Telephone Encounter (Signed)
Called pt to advise why her singulair 10 mg was declined.  Pt advises someone called an explained why.  Pt med filled on 07/06/2018 #[id number] ) refills.  No refill was appropriate until June.

## 2018-11-13 ENCOUNTER — Other Ambulatory Visit: Payer: Self-pay | Admitting: Family Medicine

## 2018-11-13 ENCOUNTER — Other Ambulatory Visit: Payer: Self-pay | Admitting: Physician Assistant

## 2018-11-13 DIAGNOSIS — J45909 Unspecified asthma, uncomplicated: Secondary | ICD-10-CM

## 2018-11-13 DIAGNOSIS — J301 Allergic rhinitis due to pollen: Secondary | ICD-10-CM

## 2018-11-13 DIAGNOSIS — K219 Gastro-esophageal reflux disease without esophagitis: Secondary | ICD-10-CM

## 2018-11-13 NOTE — Telephone Encounter (Signed)
Please advise, looks like pt is overdue for an appointment

## 2018-11-21 ENCOUNTER — Telehealth (INDEPENDENT_AMBULATORY_CARE_PROVIDER_SITE_OTHER): Payer: BC Managed Care – PPO | Admitting: Family Medicine

## 2018-11-21 ENCOUNTER — Other Ambulatory Visit: Payer: Self-pay

## 2018-11-21 DIAGNOSIS — J301 Allergic rhinitis due to pollen: Secondary | ICD-10-CM | POA: Diagnosis not present

## 2018-11-21 DIAGNOSIS — J4520 Mild intermittent asthma, uncomplicated: Secondary | ICD-10-CM | POA: Diagnosis not present

## 2018-11-21 MED ORDER — ALBUTEROL SULFATE HFA 108 (90 BASE) MCG/ACT IN AERS: 2.0000 | INHALATION_SPRAY | Freq: Four times a day (QID) | RESPIRATORY_TRACT | 2 refills | Status: DC | PRN

## 2018-11-21 MED ORDER — ALBUTEROL SULFATE 1.25 MG/3ML IN NEBU: 1.0000 | INHALATION_SOLUTION | Freq: Four times a day (QID) | RESPIRATORY_TRACT | 12 refills | Status: DC | PRN

## 2018-11-21 MED ORDER — LEVOCETIRIZINE DIHYDROCHLORIDE 5 MG PO TABS: ORAL_TABLET | ORAL | 3 refills | Status: DC

## 2018-11-21 MED ORDER — MONTELUKAST SODIUM 10 MG PO TABS: 10.0000 mg | ORAL_TABLET | Freq: Every day | ORAL | 1 refills | Status: DC

## 2018-11-21 NOTE — Progress Notes (Signed)
Virtual Visit Note  I connected with patient on 11/21/18 at 555pm by phone and verified that I am speaking with the correct person using two identifiers. Sandy Baker is currently located at home and patient is currently with them during visit. The provider, Sandy LippsIrma M Santiago, Baker is located in their office at time of visit.  I discussed the limitations, risks, security and privacy concerns of performing an evaluation and management service by telephone and the availability of in person appointments. I also discussed with the patient that there may be a patient responsible charge related to this service. The patient expressed understanding and agreed to proceed.   CC: medication refill for allergies and asthma  HPI ? PCP Dr Creta LevinStallings  Reports asthma has been doing well Uses either neb or inhaler Her inhaler has expired and has no refills Has not used her albuterol in a long time Has never used been hospitalized for asthma  Takes xyzal, singulair for her allergies Has been on this medications for years Her main trigger is fall into winter Does very well on this regime     Allergies  Allergen Reactions  . Aminoglycosides Other (See Comments)    Myasthenia Gravis alert Myasthenia Gravis alert   . Amoxicillin Swelling  . Beta Adrenergic Blockers Other (See Comments)    Myasthenia Gravis alert Myasthenia Gravis alert   . Bioflavonoids Hives  . Macrolides And Ketolides Other (See Comments)    Myasthenia Gravis alert Myasthenia Gravis alert   . Magnesium Anaphylaxis and Other (See Comments)    Myasthenia Gravis alert Myasthenia Gravis alert   . Mometasone Furo-Formoterol Fum Swelling    Patient tongue swells Patient tongue swells   . Onabotulinumtoxina Other (See Comments)    Myasthenia Gravis alert Myasthenia Gravis alert   . Penicillamine Other (See Comments)    Myasthenia Gravis alert  . Penicillins Swelling  . Povidone-Iodine Hives  . Procainamide Other  (See Comments)    Myasthenia Gravis alert Myasthenia Gravis alert   . Quinolones Other (See Comments)    Myasthenia Gravis alert Myasthenia Gravis alert   . Relpax [Eletriptan] Swelling  . Sumatriptan Anaphylaxis    Other Reaction: throat closed  . Tubocurarine Other (See Comments)    Myasthenia Gravis alert Myasthenia Gravis alert   . Botox [Botulinum Toxin Type A]   . Iodine Hives  . Latex Hives  . Quinine Derivatives Swelling  . Saline     Metal sensation in mouth  . Sodium Chloride   . Zithromax [Azithromycin] Swelling    Prior to Admission medications   Medication Sig Start Date End Date Taking? Authorizing Provider  albuterol (ACCUNEB) 1.25 MG/3ML nebulizer solution Take 3 mLs (1.25 mg total) by nebulization every 6 (six) hours as needed for wheezing. 08/14/17  Yes [name], Michael Baker, Baker  albuterol (VENTOLIN HFA) 108 (90 Base) MCG/ACT inhaler INHALE 2 PUFFS INTO THE LUNGS EVERY 4 HOURS AS NEEDED FOR WHEEZING OR SHORTNESS OF BREATH OR COUGH 08/14/18  Yes Stallings, Sandy Baker  benzonatate (TESSALON) 100 MG capsule TAKE 1 TO 2 CAPSULES(100 TO 200 MG) BY MOUTH THREE TIMES DAILY AS NEEDED FOR COUGH 04/03/17  Yes Sandy BambergMani, [name], Baker  butalbital-aspirin-caffeine [facility](FIORINAL) 50-325-40 MG per tablet Take 1 tablet by mouth 2 (two) times daily as needed for headache.   Yes Sandy Baker  candesartan (ATACAND) 8 MG tablet Take 8 mg by mouth daily.   Yes Sandy Baker  ibuprofen (ADVIL,MOTRIN) 800 MG tablet  04/03/17  Yes  Sandy Baker  levocetirizine (XYZAL) 5 MG tablet TAKE 1 TABLET(5 MG) BY MOUTH EVERY EVENING 08/14/17  Yes Sandy Baker  meloxicam (MOBIC) 15 MG tablet Take 15 mg by mouth daily.   Yes Sandy Baker  montelukast (SINGULAIR) 10 MG tablet TAKE 1 TABLET(10 MG) BY MOUTH AT BEDTIME 07/06/18  Yes Delia Chimes A, Baker  mycophenolate (CELLCEPT) 250 MG capsule  08/19/14  Yes Sandy Baker  mycophenolate (CELLCEPT) 500 MG  tablet Take by mouth 2 (two) times daily.   Yes Sandy Baker  pantoprazole (PROTONIX) 40 MG tablet Take 1 tablet (40 mg total) by mouth daily. 08/14/17  Yes Sandy Baker  predniSONE (DELTASONE) 10 MG tablet Take 20 mg by mouth daily.    Yes Sandy Baker  promethazine-codeine (PHENERGAN WITH CODEINE) 6.25-10 MG/5ML syrup Take 5 mLs by mouth every 6 (six) hours as needed for cough. 03/29/18  Yes Sandy Baker  verapamil (VERELAN PM) 240 MG 24 hr capsule Take 240 mg by mouth at bedtime.   Yes Sandy Baker  zonisamide (ZONEGRAN) 100 MG capsule Take 1 capsule (100 mg total) by mouth daily. Patient taking differently: Take 200 mg by mouth daily.  10/09/16  Yes English, Sandy Baker  ranitidine (ZANTAC) 150 MG tablet TAKE 1 TABLET(150 MG) BY MOUTH TWICE DAILY 11/28/16   Sandy Baker  sulfamethoxazole-trimethoprim (BACTRIM DS,SEPTRA DS) 800-160 MG tablet Take 1 tablet by mouth 2 (two) times daily. Patient not taking: Reported on 11/21/2018 03/29/18   Sandy Baker    Past Medical History:  Diagnosis Date  . Asthma   . Autoimmune disease ([place])   . Chiari I malformation ([place])   . Fibromyalgia   . Migraines   . Myasthenia gravis ([place])   . Sleep apnea   . TMJ (dislocation of temporomandibular joint)     Past Surgical History:  Procedure Laterality Date  . CARDIAC CATHETERIZATION     05-2017  . THYMECTOMY      Social History   Tobacco Use  . Smoking status: Never Smoker  . Smokeless tobacco: Never Used  Substance Use Topics  . Alcohol use: No    Alcohol/week: 0.0 standard drinks    Family History  Problem Relation Age of Onset  . Hypertension Mother   . Depression Mother   . Asthma Mother   . Bipolar disorder Mother   . Asthma Sister     ROS Per hpi  Objective  Vitals as reported by the patient: none   ASSESSMENT and PLAN  1. Mild intermittent asthma without complication - albuterol (ACCUNEB) 1.25  MG/3ML nebulizer solution; Take 3 mLs (1.25 mg total) by nebulization every 6 (six) hours as needed for wheezing. - albuterol (VENTOLIN HFA) 108 (90 Base) MCG/ACT inhaler; Inhale 2 puffs into the lungs every 6 (six) hours as needed for wheezing or shortness of breath. - montelukast (SINGULAIR) 10 MG tablet; Take 1 tablet (10 mg total) by mouth at bedtime.  2. Seasonal allergic rhinitis due to pollen - levocetirizine (XYZAL) 5 MG tablet; TAKE 1 TABLET(5 MG) BY MOUTH EVERY EVENING  FOLLOW-UP: 1 year   The above assessment and management plan was discussed with the patient. The patient verbalized understanding of and has agreed to the management plan. Patient is aware to call the clinic if symptoms persist or worsen. Patient is aware when to return to the clinic for a follow-up visit. Patient educated on when it is appropriate to  go to the emergency department.    I provided 10 minutes of non-face-to-face time during this encounter.  Sandy LippsIrma M Santiago, Baker [facility] [address] [place], [place] [address] Ph.  [phone] Fax [phone]

## 2018-11-21 NOTE — Progress Notes (Signed)
CC: medication refill for levocetirizine, ventolin inhaler and albuterol neb solution.  No travel outside the [place] or [place] in the past 3 weeks.  No other concerns.  Weight and bp noted in vitals-taken per pt on 10/31/2018.

## 2018-12-01 ENCOUNTER — Encounter: Payer: BC Managed Care – PPO | Admitting: Gynecology

## 2018-12-09 ENCOUNTER — Other Ambulatory Visit: Payer: Self-pay

## 2018-12-10 ENCOUNTER — Ambulatory Visit (INDEPENDENT_AMBULATORY_CARE_PROVIDER_SITE_OTHER): Payer: BC Managed Care – PPO | Admitting: Gynecology

## 2018-12-10 ENCOUNTER — Encounter: Payer: Self-pay | Admitting: Gynecology

## 2018-12-10 VITALS — BP 124/80 | Ht 67.0 in | Wt 200.0 lb

## 2018-12-10 DIAGNOSIS — L989 Disorder of the skin and subcutaneous tissue, unspecified: Secondary | ICD-10-CM

## 2018-12-10 DIAGNOSIS — Z01419 Encounter for gynecological examination (general) (routine) without abnormal findings: Secondary | ICD-10-CM

## 2018-12-10 NOTE — Progress Notes (Signed)
    Sandy Baker 1985-12-15 [id number]        33 y.o.  G0P0 for annual gynecologic exam.  Has a skin lesion that she wants me to look at.  Past medical history,surgical history, problem list, medications, allergies, family history and social history were all reviewed and documented as reviewed in the EPIC chart.  ROS:  Performed with pertinent positives and negatives included in the history, assessment and plan.   Additional significant findings : None   Exam: Caryn Bee assistant Vitals:   12/10/18 0800  BP: 124/80  Weight: 200 lb (90.7 kg)  Height: 5\' 7"  (1.702 m)   Body mass index is 31.32 kg/m.  General appearance:  Normal affect, orientation and appearance. Skin: Grossly normal excepting pigmented raised firm nodule 5 mm left lateral buttocks. HEENT: Without gross lesions.  No cervical or supraclavicular adenopathy. Thyroid normal.  Lungs:  Clear without wheezing, rales or rhonchi Cardiac: RR, without RMG Abdominal:  Soft, nontender, without masses, guarding, rebound, organomegaly or hernia Breasts:  Examined lying and sitting without masses, retractions, discharge or axillary adenopathy. Pelvic:  Ext, BUS, Vagina: Normal  Cervix: Normal  Uterus: Retroverted, normal size, shape and contour, midline and mobile nontender   Adnexa: Without masses or tenderness    Anus and perineum: Normal   Rectovaginal: Normal sphincter tone without palpated masses or tenderness.    Assessment/Plan:  33 y.o. G0P0 female for annual gynecologic exam.  With regular menses, abstinent contraception  1. Skin lesion.  Questionable dermatofibroma.  Been present for several years.  Options reviewed to include observation and follow-up if changes at all, dermatology evaluation or excision by myself.  Patient wants to think of her options and will follow-up as she chooses. 2. Contraception.  Not sexually active nor plans to be. 3. Breast health.  Breast exam normal today.  SBE monthly follow-up.   Plan baseline mammography at age [age]. Pap smear 2019.  No Pap smear done today.  No history of abnormal Pap smears previously.  Plan Pap smear/HPV at 3-year interval from her last Pap smear per current screening guidelines. 5. Health maintenance.  No routine lab work done as patient does this elsewhere.  Follow-up 1 year, sooner as needed.   Anastasio Auerbach MD, 8:31 AM 12/10/2018

## 2018-12-10 NOTE — Patient Instructions (Signed)
Follow-up to have the skin lesion excised as we discussed or follow-up with dermatology.  Follow-up in 1 year for annual exam

## 2018-12-29 ENCOUNTER — Encounter: Payer: Self-pay | Admitting: Gynecology

## 2019-03-07 ENCOUNTER — Telehealth: Payer: Self-pay | Admitting: Family Medicine

## 2019-03-07 DIAGNOSIS — J4520 Mild intermittent asthma, uncomplicated: Secondary | ICD-10-CM

## 2019-03-07 NOTE — Telephone Encounter (Signed)
Forwarding medication refill request to the clinical pool for review. 

## 2019-03-09 NOTE — Telephone Encounter (Signed)
Pt calling to find out why her  albuterol (VENTOLIN HFA) 108 (90 Base) MCG/ACT inhaler Was denied.

## 2019-05-06 ENCOUNTER — Ambulatory Visit (INDEPENDENT_AMBULATORY_CARE_PROVIDER_SITE_OTHER): Payer: BC Managed Care – PPO | Admitting: Family Medicine

## 2019-05-06 ENCOUNTER — Other Ambulatory Visit: Payer: Self-pay

## 2019-05-06 ENCOUNTER — Encounter (INDEPENDENT_AMBULATORY_CARE_PROVIDER_SITE_OTHER): Payer: Self-pay | Admitting: Family Medicine

## 2019-05-06 VITALS — BP 106/68 | HR 94 | Temp 98.5°F | Ht 67.0 in | Wt 216.0 lb

## 2019-05-06 DIAGNOSIS — Z1331 Encounter for screening for depression: Secondary | ICD-10-CM

## 2019-05-06 DIAGNOSIS — R7303 Prediabetes: Secondary | ICD-10-CM | POA: Diagnosis not present

## 2019-05-06 DIAGNOSIS — Z9189 Other specified personal risk factors, not elsewhere classified: Secondary | ICD-10-CM

## 2019-05-06 DIAGNOSIS — E538 Deficiency of other specified B group vitamins: Secondary | ICD-10-CM

## 2019-05-06 DIAGNOSIS — Z0289 Encounter for other administrative examinations: Secondary | ICD-10-CM

## 2019-05-06 DIAGNOSIS — R5383 Other fatigue: Secondary | ICD-10-CM | POA: Diagnosis not present

## 2019-05-06 DIAGNOSIS — R0602 Shortness of breath: Secondary | ICD-10-CM

## 2019-05-06 DIAGNOSIS — G4733 Obstructive sleep apnea (adult) (pediatric): Secondary | ICD-10-CM

## 2019-05-06 DIAGNOSIS — E559 Vitamin D deficiency, unspecified: Secondary | ICD-10-CM | POA: Diagnosis not present

## 2019-05-06 DIAGNOSIS — Z6833 Body mass index (BMI) 33.0-33.9, adult: Secondary | ICD-10-CM

## 2019-05-06 DIAGNOSIS — E669 Obesity, unspecified: Secondary | ICD-10-CM

## 2019-05-07 NOTE — Progress Notes (Signed)
Chief Complaint:   OBESITY Sandy Baker (MR# [id number]) is a 34 y.o. female who presents for evaluation and treatment of obesity and related comorbidities. Current BMI is Body mass index is 33.83 kg/m.Sandy Baker has been struggling with her weight for many years and has been unsuccessful in either losing weight, maintaining weight loss, or reaching her healthy weight goal. Sandy Baker's sister comes to the clinic. Sandy Baker can't have labs drawn today, secondary to recent infusion.  Sandy Baker is currently in the action stage of change and ready to dedicate time achieving and maintaining a healthier weight. Sandy Baker is interested in becoming our patient and working on intensive lifestyle modifications including (but not limited to) diet and exercise for weight loss.  Sandy Baker's habits were reviewed today and are as follows: Her family eats meals together, she thinks her family will eat healthier with her, she started gaining weight after taking medication prednisone, her heaviest weight ever was 224 pounds, she is a picky eater and doesn't like to eat healthier foods, she snacks frequently in the evenings, she is trying to follow a vegetarian diet and she is frequently drinking liquids with calories.  Breakfast consists of [place] grits and eggs (eats all, feels full). Lunch is approximately 2:30 PM and consists of [employer] burrito bowl with steak, lettuce, cheese, sour cream and ranch (eats all, feels full). She still feels full at dinner. A typical dinner consists of shrimp tacos with flour tortillas with approximately 5 shrimp in each (feels full). She eats 1 to 2 Oreos around 3:00 AM (craving).  Depression Screen Sandy Baker's Food and Mood (modified PHQ-9) score was negative.  Depression screen [name] 2/9 05/06/2019  Decreased Interest 0  Down, Depressed, Hopeless 0  PHQ - 2 Score 0  Altered sleeping 3  Tired, decreased energy 0  Change in appetite 0  Feeling bad or failure about yourself  0    Trouble concentrating 0  Moving slowly or fidgety/restless 0  Suicidal thoughts 0  PHQ-9 Score 3  Difficult doing work/chores Not difficult at all   Subjective:   Other fatigue (new) Sandy Baker admits to daytime somnolence and denies waking up still tired. Patent has a history of symptoms of daytime fatigue and morning headache. Sandy Baker generally gets 8 or more hours of sleep per night, and states that she has generally restful sleep. Snoring is present. Apneic episodes are present. Epworth Sleepiness Score is 8. EKG ordered today shows normal sinus rhythm at 93 BPM.  Shortness of breath on exertion (new) Sandy Baker notes increasing shortness of breath with exercising and seems to be worsening over time with weight gain. She notes getting out of breath sooner with activity than she used to.This has not gotten worse recently. Sandy Baker denies shortness of breath at rest and admits to orthopnea.   Prediabetes  Sandy Baker has a diagnosis of prediabetes based on her elevated Hgb A1c and was informed this puts her at greater risk of developing diabetes. Her last Hgb A1c was 6.0 in December 2020. Sandy Baker is on long term prednisone treatment for Myasthenia Gravis. She is attempting to work on diet and exercise to decrease her risk of diabetes. She denies nausea or hypoglycemia.  Vitamin D deficiency Sandy Baker admits to fatigue, but she has a history of Myasthenia Gravis. She is not currently taking vit D.   OSA (obstructive sleep apnea) Sandy Baker has a diagnosis of sleep apnea. Sandy Baker mis on CPAP machine Trilogy nightly. She was diagnosed years ago.   B12 nutritional deficiency Sandy Baker was diagnosed  in 2009. She is not on B12 supplementation. She is a vegetarian. She does not have a previous diagnosis of pernicious anemia.  She does not have a history of weight loss surgery.   At risk for diabetes mellitus Sandy Baker is at higher than average risk for developing diabetes due to her obesity and prediabetes.    Assessment/Plan:   Other fatigue (new) Sandy Baker does feel that her weight is causing her energy to be lower than it should be. Fatigue may be related to obesity, depression or many other causes. EKG will be ordered, and in the meanwhile, Sandy Baker will focus on self care including making healthy food choices, increasing physical activity and focusing on stress reduction.  Shortness of breath on exertion (new) Sandy Baker does feel that she gets out of breath more easily that she used to when she exercises. Sandy Baker's shortness of breath appears to be obesity related and exercise induced. She has agreed to work on weight loss and gradually increase exercise to treat her exercise induced shortness of breath. Will continue to monitor closely. Indirect calorimetry will be ordered today.  Prediabetes Sandy Baker will begin to work on weight loss, exercise, and decreasing simple carbohydrates to help decrease the risk of diabetes. Will discuss at the next appointment, labs from December 2020.  Vitamin D deficiency Low Vitamin D level contributes to fatigue and are associated with obesity, breast, and colon cancer. Sandy Baker agrees to start OTC Vitamin D @5 ,000 IU daily and she will follow-up for routine testing of Vitamin D, at least 2-3 times per year to avoid over-replacement.  OSA (obstructive sleep apnea) Intensive lifestyle modifications are the first line treatment for this issue. We discussed several lifestyle modifications today and she will begin to work on diet, exercise and weight loss efforts. We will continue to monitor. Sandy Baker will follow up with the sleep doctor as needed. Orders and follow up as documented in patient record.   Counseling  Sleep apnea is a condition in which breathing pauses or becomes shallow during sleep. This happens over and over during the night. This disrupts your sleep and keeps your body from getting the rest that it needs, which can cause tiredness and lack of energy  (fatigue) during the day.  Sleep apnea treatment: If you were given a device to open your airway while you sleep, USE IT!  Sleep hygiene:   Limit or avoid alcohol, caffeinated beverages, and cigarettes, especially close to bedtime.   Do not eat a large meal or eat spicy foods right before bedtime. This can lead to digestive discomfort that can make it hard for you to sleep.  Keep a sleep diary to help you and your health care provider figure out what could be causing your insomnia.  . Make your bedroom a dark, comfortable place where it is easy to fall asleep. ? Put up shades or blackout curtains to block light from outside. ? Use a white noise machine to block noise. ? Keep the temperature cool. . Limit screen use before bedtime. This includes: ? Watching TV. ? Using your smartphone, tablet, or computer. . Stick to a routine that includes going to bed and waking up at the same times every day and night. This can help you fall asleep faster. Consider making a quiet activity, such as reading, part of your nighttime routine. . Try to avoid taking naps during the day so that you sleep better at night. . Get out of bed if you are still awake after 15 minutes of  trying to sleep. Keep the lights down, but try reading or doing a quiet activity. When you feel sleepy, go back to bed.   B12 nutritional deficiency The diagnosis was reviewed with the patient. Counseling provided today, see below. We will continue to monitor. Avya will need labs at the next available lab draw. Orders and follow up as documented in patient record.  Counseling . The body needs vitamin B12: to make red blood cells; to make DNA; and to help the nerves work properly so they can carry messages from the brain to the body.  . The main causes of vitamin B12 deficiency include dietary deficiency, digestive diseases, pernicious anemia, and having a surgery in which part of the stomach or small intestine is removed.   . Certain medicines can make it harder for the body to absorb vitamin B12. These medicines include: heartburn medications; some antibiotics; some medications used to treat diabetes, gout, and high cholesterol.  . In some cases, there are no symptoms of this condition. If the condition leads to anemia or nerve damage, various symptoms can occur, such as weakness or fatigue, shortness of breath, and numbness or tingling in your hands and feet.   . Treatment:  o May include taking vitamin B12 supplements.  o Avoid alcohol.  o Eat lots of healthy foods that contain vitamin B12: - Beef, pork, chicken, [place], and organ meats, such as liver.  - Seafood: This includes clams, rainbow trout, salmon, tuna, and haddock. Eggs.  - Cereal and dairy products that are fortified: This means that vitamin B12 has been added to the food.   At risk for diabetes mellitus Neira was given approximately 15 minutes of diabetes education and counseling today. We discussed intensive lifestyle modifications today with an emphasis on weight loss as well as increasing exercise and decreasing simple carbohydrates in her diet. We also reviewed medication options with an emphasis on risk versus benefit of those discussed.   Repetitive spaced learning was employed today to elicit superior memory formation and behavioral change.  Class 1 obesity with serious comorbidity and body mass index (BMI) of 33.0 to 33.9 in adult, unspecified obesity type [place] is currently in the action stage of change and her goal is to continue with weight loss efforts. I recommend Aranda begin the structured treatment plan as follows:  She has agreed to the Category 3 Plan +100 calories.  Exercise goals: No exercise has been prescribed at this time.   Behavioral modification strategies: increasing lean protein intake, increasing vegetables, meal planning and cooking strategies, keeping healthy foods in the home and planning for success.  She  was informed of the importance of frequent follow-up visits to maximize her success with intensive lifestyle modifications for her multiple health conditions. She was informed we would discuss her lab results at her next visit unless there is a critical issue that needs to be addressed sooner. Mabelle agreed to keep her next visit at the agreed upon time to discuss these results.  Objective:   Blood pressure 106/68, pulse 94, temperature 98.5 F (36.9 C), temperature source Oral, height 5\' 7"  (1.702 m), weight 216 lb (98 kg), last menstrual period 04/17/2019, SpO2 96 %. Body mass index is 33.83 kg/m.  EKG: Normal sinus rhythm, rate 93 BPM.  Indirect Calorimeter completed today shows a VO2 of 283 and a REE of 1970.  Her calculated basal metabolic rate is 7782 thus her basal metabolic rate is better than expected.  General: Cooperative, alert, well developed, in  no acute distress. HEENT: Conjunctivae and lids unremarkable. Cardiovascular: Regular rhythm.  Lungs: Normal work of breathing. Neurologic: No focal deficits.   Lab Results  Component Value Date   CREATININE 0.52 12/28/2015   BUN 7 12/28/2015   NA 139 12/28/2015   K 4.0 12/28/2015   CL 99 12/28/2015   CO2 33 (H) 12/28/2015   Lab Results  Component Value Date   ALT 14 12/28/2015   AST 17 12/28/2015   ALKPHOS 38 12/28/2015   BILITOT 0.4 12/28/2015   No results found for: HGBA1C No results found for: INSULIN Lab Results  Component Value Date   TSH 0.85 12/28/2016   No results found for: CHOL, HDL, LDLCALC, LDLDIRECT, TRIG, CHOLHDL Lab Results  Component Value Date   WBC 11.0 (H) 12/28/2016   HGB 10.5 (L) 12/28/2016   HCT 36.7 12/28/2016   MCV 81.7 12/28/2016   PLT 287 12/28/2016   No results found for: IRON, TIBC, FERRITIN   Attestation Statements:   Reviewed by clinician on day of visit: allergies, medications, problem list, medical history, surgical history, family history, social history, and previous  encounter notes.  I, [place] Crane, am acting as transcriptionist for Filbert Schilder, MD.  I have reviewed the above documentation for accuracy and completeness, and I agree with the above. Filbert Schilder, MD

## 2019-05-17 ENCOUNTER — Other Ambulatory Visit: Payer: Self-pay | Admitting: Family Medicine

## 2019-05-17 DIAGNOSIS — J4520 Mild intermittent asthma, uncomplicated: Secondary | ICD-10-CM

## 2019-05-17 NOTE — Telephone Encounter (Signed)
Requested Prescriptions  Pending Prescriptions Disp Refills  . montelukast (SINGULAIR) 10 MG tablet [Pharmacy Med Name: MONTELUKAST 10MG  TABLETS] 90 tablet 1    Sig: TAKE 1 TABLET(10 MG) BY MOUTH AT BEDTIME     Pulmonology:  Leukotriene Inhibitors Passed - 05/17/2019 10:09 AM      Passed - Valid encounter within last 12 months    Recent Outpatient Visits          5 months ago Mild intermittent asthma without complication   Primary Care at 07/15/2019, Oneita Jolly, MD   1 year ago Acute upper respiratory infection   [facility] at [facility], [facility], FNP   1 year ago Acute midline low back pain without sciatica   Primary Care at [facility], Sharlene Motts, MD   1 year ago URI with cough and congestion   Primary Care at [facility], Otho Bellows, [name]   1 year ago Dysarthria   Primary Care at [facility], [facility], [name]

## 2019-05-20 ENCOUNTER — Ambulatory Visit (INDEPENDENT_AMBULATORY_CARE_PROVIDER_SITE_OTHER): Payer: BC Managed Care – PPO | Admitting: Family Medicine

## 2019-05-20 ENCOUNTER — Encounter (INDEPENDENT_AMBULATORY_CARE_PROVIDER_SITE_OTHER): Payer: Self-pay | Admitting: Family Medicine

## 2019-05-20 ENCOUNTER — Other Ambulatory Visit: Payer: Self-pay

## 2019-05-20 VITALS — BP 133/78 | HR 88 | Temp 98.1°F | Ht 67.0 in | Wt 224.0 lb

## 2019-05-20 DIAGNOSIS — R5383 Other fatigue: Secondary | ICD-10-CM

## 2019-05-20 DIAGNOSIS — R0602 Shortness of breath: Secondary | ICD-10-CM | POA: Diagnosis not present

## 2019-05-20 DIAGNOSIS — Z9189 Other specified personal risk factors, not elsewhere classified: Secondary | ICD-10-CM

## 2019-05-20 DIAGNOSIS — R7303 Prediabetes: Secondary | ICD-10-CM | POA: Diagnosis not present

## 2019-05-20 DIAGNOSIS — E559 Vitamin D deficiency, unspecified: Secondary | ICD-10-CM

## 2019-05-20 DIAGNOSIS — E538 Deficiency of other specified B group vitamins: Secondary | ICD-10-CM

## 2019-05-20 DIAGNOSIS — Z6835 Body mass index (BMI) 35.0-35.9, adult: Secondary | ICD-10-CM

## 2019-05-20 NOTE — Progress Notes (Signed)
Chief Complaint:   OBESITY Sandy Baker is here to discuss her progress with her obesity treatment plan along with follow-up of her obesity related diagnoses. Sandy Baker is on the Category 3 Plan +100 calories and states she is following her eating plan approximately 100% of the time. Sandy Baker states she is exercising 0 minutes 0 times per week.  Today's visit was #: 2 Starting weight: 216 lbs Starting date: 05/06/2019 Today's weight: 224 lbs Today's date: 05/20/2019 Total lbs lost to date: 0 Total lbs lost since last in-office visit: 0  Interim History: Sandy Baker did make changes in the past two week. She decreased [employer] breakfast to once per week and she changed her [employer] order to the salad option, and she did find out that fruit alone at breakfast didn't satiate. Sandy Baker did notice that she was previously eating quite a bit of protein.  Subjective:   Prediabetes  Sandy Baker has a diagnosis of prediabetes and her last A1c was 6.0 on 03/27/19. She has had this diagnosis since January 2020. She is not on metformin. She was informed this puts her at greater risk of developing diabetes. She continues to work on diet and exercise to decrease her risk of diabetes.   No results found for: HGBA1C No results found for: INSULIN  Shortness of breath on exertion  EKG was done at the last appointment and showed normal sinus rhythm at 93 BPM.  Other fatigue  EKG was done at the last appointment and showed normal sinus rhythm at 93 BPM.  Vitamin D deficiency Sandy Baker has historical diagnosis of vitamin D deficiency. She is not currently taking vit D. She admits fatigue.  B12 deficiency  Sandy Baker has historical diagnosis of B12 deficiency. She admits fatigue.  No results found for: VITAMINB12  At risk for diabetes mellitus Sandy Baker is at higher than average risk for developing diabetes due to her obesity and prediabetes.   Assessment/Plan:   Prediabetes   We will check insulin level  today. Sandy Baker will continue to work on weight loss, exercise, and decreasing simple carbohydrates to help decrease the risk of diabetes.   Shortness of breath on exertion  We will check thyroid panel and labs (Folate, T3, T4, free, TSH).  Other fatigue  We will check thyroid panel and labs today (Folate, T3, T4, free, TSH).  Vitamin D deficiency   Low Vitamin D level contributes to fatigue and are associated with obesity, breast, and colon cancer. We will check vitamin D level today.  B12 deficiency  Vitamin B12 The diagnosis was reviewed with the patient. Counseling provided today, see below. We will check B12 level today and will continue to monitor. Orders and follow up as documented in patient record.  Counseling . The body needs vitamin B12: to make red blood cells; to make DNA; and to help the nerves work properly so they can carry messages from the brain to the body.  . The main causes of vitamin B12 deficiency include dietary deficiency, digestive diseases, pernicious anemia, and having a surgery in which part of the stomach or small intestine is removed.  . Certain medicines can make it harder for the body to absorb vitamin B12. These medicines include: heartburn medications; some antibiotics; some medications used to treat diabetes, gout, and high cholesterol.  . In some cases, there are no symptoms of this condition. If the condition leads to anemia or nerve damage, various symptoms can occur, such as weakness or fatigue, shortness of breath, and numbness or  tingling in your hands and feet.   . Treatment:  o May include taking vitamin B12 supplements.  o Avoid alcohol.  o Eat lots of healthy foods that contain vitamin B12: - Beef, pork, chicken, [place], and organ meats, such as liver.  - Seafood: This includes clams, rainbow trout, salmon, tuna, and haddock. Eggs.  - Cereal and dairy products that are fortified: This means that vitamin B12 has been added to the food.   At risk  for diabetes mellitus Sandy Baker was given approximately 15 minutes of diabetes education and counseling today. We discussed intensive lifestyle modifications today with an emphasis on weight loss as well as increasing exercise and decreasing simple carbohydrates in her diet. We also reviewed medication options with an emphasis on risk versus benefit of those discussed.   Repetitive spaced learning was employed today to elicit superior memory formation and behavioral change.  Class 2 severe obesity with serious comorbidity and body mass index (BMI) of 35.0 to 35.9 in adult, unspecified obesity type (HCC) Sandy Baker is currently in the action stage of change. As such, her goal is to continue with weight loss efforts. She has agreed to keeping a food journal and adhering to recommended goals of 1500 to 1650 calories and 110+ grams of protein daily.   Exercise goals: No exercise has been prescribed at this time.  Behavioral modification strategies: increasing lean protein intake, increasing vegetables, decreasing eating out, meal planning and cooking strategies, planning for success and keeping a strict food journal.  Sandy Baker has agreed to follow-up with our clinic in 2 weeks. She was informed of the importance of frequent follow-up visits to maximize her success with intensive lifestyle modifications for her multiple health conditions.   Sandy Baker was informed we would discuss her lab results at her next visit unless there is a critical issue that needs to be addressed sooner. Sandy Baker agreed to keep her next visit at the agreed upon time to discuss these results.  Objective:   Blood pressure 133/78, pulse 88, temperature 98.1 F (36.7 C), temperature source Oral, height 5\' 7"  (1.702 m), weight 224 lb (101.6 kg), SpO2 97 %. Body mass index is 35.08 kg/m.  General: Cooperative, alert, well developed, in no acute distress. HEENT: Conjunctivae and lids unremarkable. Cardiovascular: Regular rhythm.    Lungs: Normal work of breathing. Neurologic: No focal deficits.   Lab Results  Component Value Date   CREATININE 0.52 12/28/2015   BUN 7 12/28/2015   NA 139 12/28/2015   K 4.0 12/28/2015   CL 99 12/28/2015   CO2 33 (H) 12/28/2015   Lab Results  Component Value Date   ALT 14 12/28/2015   AST 17 12/28/2015   ALKPHOS 38 12/28/2015   BILITOT 0.4 12/28/2015   No results found for: HGBA1C No results found for: INSULIN Lab Results  Component Value Date   TSH 0.85 12/28/2016   No results found for: CHOL, HDL, LDLCALC, LDLDIRECT, TRIG, CHOLHDL Lab Results  Component Value Date   WBC 11.0 (H) 12/28/2016   HGB 10.5 (L) 12/28/2016   HCT 36.7 12/28/2016   MCV 81.7 12/28/2016   PLT 287 12/28/2016   No results found for: IRON, TIBC, FERRITIN   Attestation Statements:   Reviewed by clinician on day of visit: allergies, medications, problem list, medical history, surgical history, family history, social history, and previous encounter notes.  I, Doreene Nest, am acting as transcriptionist for Eber Jones, MD.  I have reviewed the above documentation for accuracy and completeness, and  I agree with the above. - Ilene Qua, MD

## 2019-06-02 ENCOUNTER — Encounter (INDEPENDENT_AMBULATORY_CARE_PROVIDER_SITE_OTHER): Payer: Self-pay | Admitting: Family Medicine

## 2019-06-02 ENCOUNTER — Ambulatory Visit (INDEPENDENT_AMBULATORY_CARE_PROVIDER_SITE_OTHER): Payer: BC Managed Care – PPO | Admitting: Family Medicine

## 2019-06-02 ENCOUNTER — Other Ambulatory Visit: Payer: Self-pay

## 2019-06-02 VITALS — BP 119/74 | HR 94 | Temp 98.5°F | Ht 67.0 in | Wt 223.0 lb

## 2019-06-02 DIAGNOSIS — J9612 Chronic respiratory failure with hypercapnia: Secondary | ICD-10-CM

## 2019-06-02 DIAGNOSIS — Z6835 Body mass index (BMI) 35.0-35.9, adult: Secondary | ICD-10-CM

## 2019-06-02 DIAGNOSIS — I10 Essential (primary) hypertension: Secondary | ICD-10-CM

## 2019-06-02 NOTE — Progress Notes (Signed)
Chief Complaint:   OBESITY Sandy Baker is here to discuss her progress with her obesity treatment plan along with follow-up of her obesity related diagnoses. Sandy Baker is keeping a food journal of 1500 to 1650 calories and 110 grams of protein daily and states she is following her eating plan approximately 95% of the time. Sandy Baker states she is jumping rope and exercising with the hula hoop 30 minutes 1 time per week.  Today's visit was #: 3 Starting weight: 216 lbs Starting date: 05/06/2019 Today's weight: 223 lbs Today's date: 06/02/2019 Total lbs lost to date: 0 Total lbs lost since last in-office visit: 1  Interim History: Sandy Baker voices that she has not achieved her calorie goal and not her protein goal, secondary to the goal on MyFitnessPal being set at 1200 calories. She has not felt hungry. Sandy Baker couldn't get her bloodwork done at the last appointment. She is going to go next week.  Subjective:   Essential hypertension Sandy Baker's blood pressure is well controlled. She denies chest pain, chest pressure or headache.  BP Readings from Last 3 Encounters:  06/02/19 119/74  05/20/19 133/78  05/06/19 106/68   Lab Results  Component Value Date   CREATININE 0.52 12/28/2015   CREATININE 0.64 11/07/2008   Chronic respiratory failure with hypercapnia (HCC) Sandy Baker is on Trilogy. She is going to use her tax money to buy equipment to prepare in case her power goes out.   Assessment/Plan:   Essential hypertension Sandy Baker is working on healthy weight loss and exercise to improve blood pressure control. She will continue her current medications. We will watch for signs of hypotension as she continues her lifestyle modifications.  Chronic respiratory failure with hypercapnia (HCC) Sandy Baker will follow up with her pulmonologist at the next appointment.  Class 2 severe obesity with serious comorbidity and body mass index (BMI) of 35.0 to 35.9 in adult, unspecified obesity type  (HCC) Sandy Baker is currently in the action stage of change. As such, her goal is to continue with weight loss efforts. She has agreed to keeping a food journal and adhering to recommended goals of 1500 to 1650 calories and 110+ grams of protein daily.   Exercise goals: No exercise has been prescribed at this time.  Behavioral modification strategies: increasing lean protein intake, increasing vegetables, meal planning and cooking strategies, keeping healthy foods in the home and planning for success.  Sandy Baker has agreed to follow-up with our clinic in 2 weeks. She was informed of the importance of frequent follow-up visits to maximize her success with intensive lifestyle modifications for her multiple health conditions.   Objective:   Blood pressure 119/74, pulse 94, temperature 98.5 F (36.9 C), temperature source Oral, height 5\' 7"  (1.702 m), weight 223 lb (101.2 kg), last menstrual period 05/14/2019, SpO2 97 %. Body mass index is 34.93 kg/m.  General: Cooperative, alert, well developed, in no acute distress. HEENT: Conjunctivae and lids unremarkable. Cardiovascular: Regular rhythm.  Lungs: Normal work of breathing. Neurologic: No focal deficits.   Lab Results  Component Value Date   CREATININE 0.52 12/28/2015   BUN 7 12/28/2015   NA 139 12/28/2015   K 4.0 12/28/2015   CL 99 12/28/2015   CO2 33 (H) 12/28/2015   Lab Results  Component Value Date   ALT 14 12/28/2015   AST 17 12/28/2015   ALKPHOS 38 12/28/2015   BILITOT 0.4 12/28/2015   No results found for: HGBA1C No results found for: INSULIN Lab Results  Component Value Date  TSH 0.85 12/28/2016   No results found for: CHOL, HDL, LDLCALC, LDLDIRECT, TRIG, CHOLHDL Lab Results  Component Value Date   WBC 11.0 (H) 12/28/2016   HGB 10.5 (L) 12/28/2016   HCT 36.7 12/28/2016   MCV 81.7 12/28/2016   PLT 287 12/28/2016   No results found for: IRON, TIBC, FERRITIN   Attestation Statements:   Reviewed by clinician on  day of visit: allergies, medications, problem list, medical history, surgical history, family history, social history, and previous encounter notes.  Time spent on visit including pre-visit chart review and post-visit care was 15 minutes.   I, Doreene Nest, am acting as transcriptionist for Coralie Common, MD.  I have reviewed the above documentation for accuracy and completeness, and I agree with the above. - Ilene Qua, MD

## 2019-06-16 ENCOUNTER — Ambulatory Visit (INDEPENDENT_AMBULATORY_CARE_PROVIDER_SITE_OTHER): Payer: BC Managed Care – PPO | Admitting: Family Medicine

## 2019-07-23 ENCOUNTER — Ambulatory Visit: Payer: Self-pay | Attending: Internal Medicine

## 2019-07-23 ENCOUNTER — Other Ambulatory Visit: Payer: Self-pay | Admitting: Family Medicine

## 2019-07-23 DIAGNOSIS — Z23 Encounter for immunization: Secondary | ICD-10-CM

## 2019-07-23 NOTE — Telephone Encounter (Signed)
meloxicam (MOBIC) 15 MG tablet     Patient is requesting refill.   Pharmacy:  [facility] #[id number] [place], [place] - [address] AT [facility] OF [address] & [address] Phone:  [phone]  Fax:  [phone]

## 2019-07-23 NOTE — Progress Notes (Signed)
   Covid-19 Vaccination Clinic  Name:  ADAIAH JASKOT    MRN: [id number] DOB: [birth year]  07/23/2019  Ms. Simenson was observed post Covid-19 immunization for 15 minutes without incident. She was provided with Vaccine Information Sheet and instruction to access the V-Safe system.   Ms. Rewis was instructed to call 911 with any severe reactions post vaccine: Marland Kitchen Difficulty breathing  . Swelling of face and throat  . A fast heartbeat  . A bad rash all over body  . Dizziness and weakness   Immunizations Administered    Name Date Dose VIS Date Route   Pfizer COVID-19 Vaccine 07/23/2019  1:23 PM 0.3 mL 03/20/2019 Intramuscular   Manufacturer: [employer], [employer]   Lot: [id number]   NDC: [address]-4

## 2019-07-23 NOTE — Telephone Encounter (Signed)
Requested medication (s) are due for refill today: Yes  Requested medication (s) are on the active medication list: Yes  Last refill:  12/28/15  Future visit scheduled: No  Notes to clinic:  3 years ago by historical provider.    Requested Prescriptions  Pending Prescriptions Disp Refills   meloxicam (MOBIC) 15 MG tablet       Sig: Take 1 tablet (15 mg total) by mouth daily.      Analgesics:  COX2 Inhibitors Failed - 07/23/2019  4:41 PM      Failed - HGB in normal range and within 360 days    Hemoglobin  Date Value Ref Range Status  12/28/2016 10.5 (L) 11.7 - 15.5 g/dL Final          Failed - Cr in normal range and within 360 days    Creat  Date Value Ref Range Status  12/28/2015 0.52 0.50 - 1.10 mg/dL Final          Passed - Patient is not pregnant      Passed - Valid encounter within last 12 months    Recent Outpatient Visits           8 months ago Mild intermittent asthma without complication   [facility], Meda Coffee, MD   1 year ago Acute upper respiratory infection   [facility] at [facility], Godfrey Pick, FNP   1 year ago Acute midline low back pain without sciatica   [facility], Manus Rudd, MD   1 year ago URI with cough and congestion   [facility], Marolyn Hammock, [name]   1 year ago Dysarthria   [facility] at [facility], Marolyn Hammock, [name]

## 2019-08-13 ENCOUNTER — Telehealth: Payer: Self-pay | Admitting: Family Medicine

## 2019-08-13 DIAGNOSIS — J4520 Mild intermittent asthma, uncomplicated: Secondary | ICD-10-CM

## 2019-08-13 MED ORDER — MELOXICAM 15 MG PO TABS: 15.0000 mg | ORAL_TABLET | Freq: Every day | ORAL | 0 refills | Status: DC | PRN

## 2019-08-13 MED ORDER — MONTELUKAST SODIUM 10 MG PO TABS: ORAL_TABLET | ORAL | 1 refills | Status: DC

## 2019-08-13 NOTE — Telephone Encounter (Signed)
Patient states she a virtual with [place] 05/17/2019 and called for refill on 07/23/2019 for  What is the name of the medication? meloxicam (MOBIC) 15 MG tablet [[id number]]  montelukast (SINGULAIR) 10 MG tablet [[id number]   Have you contacted your pharmacy to request a refill? y  Which pharmacy would you like this sent to? [facility] #[id number] [place], [place] - [phone] AT [facility] OF [facility] & [address]  [address] [place] [address]  Phone:  [phone] Fax:  [phone]  DEA #:  [id number]   Patient notified that their request is being sent to the clinical staff for review and that they should receive a call once it is complete. If they do not receive a call within 72 hours they can check with their pharmacy or our office.

## 2019-08-13 NOTE — Telephone Encounter (Signed)
Normal crt April 2021 - [facility]

## 2019-08-13 NOTE — Telephone Encounter (Signed)
Pt had a virtual with you on 2/7 and is requesting Meloxicam and Singulair.  Please Advise

## 2019-08-14 ENCOUNTER — Other Ambulatory Visit: Payer: Self-pay

## 2019-08-14 ENCOUNTER — Telehealth (INDEPENDENT_AMBULATORY_CARE_PROVIDER_SITE_OTHER): Payer: BC Managed Care – PPO | Admitting: Family Medicine

## 2019-08-14 DIAGNOSIS — J4520 Mild intermittent asthma, uncomplicated: Secondary | ICD-10-CM

## 2019-08-14 MED ORDER — MONTELUKAST SODIUM 10 MG PO TABS: ORAL_TABLET | ORAL | 1 refills | Status: DC

## 2019-08-14 MED ORDER — MELOXICAM 15 MG PO TABS: 15.0000 mg | ORAL_TABLET | Freq: Every day | ORAL | 0 refills | Status: AC | PRN

## 2019-08-14 NOTE — Patient Instructions (Addendum)
° ° ° °  If you have lab work done today you will be contacted with your lab results within the next 2 weeks.  If you have not heard from us then please contact us. The fastest way to get your results is to register for My Chart. ° ° °IF you received an x-ray today, you will receive an invoice from [facility]. Please contact [facility] at [phone] with questions or concerns regarding your invoice.  ° °IF you received labwork today, you will receive an invoice from [facility]. Please contact [facility] at [phone] with questions or concerns regarding your invoice.  ° °Our billing staff will not be able to assist you with questions regarding bills from these companies. ° °You will be contacted with the lab results as soon as they are available. The fastest way to get your results is to activate your My Chart account. Instructions are located on the last page of this paperwork. If you have not heard from us regarding the results in 2 weeks, please contact this office. °  ° ° ° °

## 2019-08-14 NOTE — Progress Notes (Signed)
Telemedicine Encounter- SOAP NOTE Established Patient  This telephone encounter was conducted with the patient's (or proxy's) verbal consent via audio telecommunications: yes/no: Yes Patient was instructed to have this encounter in a suitably private space; and to only have persons present to whom they give permission to participate. In addition, patient identity was confirmed by use of name plus two identifiers (DOB and address).  I discussed the limitations, risks, security and privacy concerns of performing an evaluation and management service by telephone and the availability of in person appointments. I also discussed with the patient that there may be a patient responsible charge related to this service. The patient expressed understanding and agreed to proceed.  I spent a total of TIME; 0 MIN TO 60 MIN: 15 minutes talking with the patient or their proxy.  Chief Complaint  Patient presents with  . Medication Refill    says she has no other medical concerns at this time    Subjective   Sandy Baker is a 34 y.o. established patient. Telephone visit today for  HPI  Reports that she has not had an asthma attack in a couple of years. Asthma is very well controlled She needs a refill of the montelukast  She has not needed her albuterol since December 2020 Season changes are her trigger The mask has helped her asthma as she has avoided the pollen. She received her first covid vaccine a few weeks ago   Patient Active Problem List   Diagnosis Date Noted  . Tachycardia 01/20/2016  . 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 Dysarthria 12/26/2015  . Migraine without aura and without status migrainosus, not intractable 12/21/2015  . Obstructive sleep apnea syndrome 11/05/2014  . GERD (gastroesophageal reflux disease) 09/12/2013  . Asthma without status asthmaticus 09/09/2013  . Fibromyalgia 09/09/2013  . Allergic rhinitis 09/09/2013  . Myasthenia gravis (HCC) 06/19/2011     Past Medical History:  Diagnosis Date  . Acute respiratory failure (HCC)    With hypoxia and hypercapnea  . Allergic rhinitis   . Asthma   . Autoimmune disease (HCC)   . B12 deficiency   . Chiari I malformation (HCC)   . Congestive heart failure (HCC)   . Depression   . Fibromyalgia   . Heartburn   . Migraines   . Multiple food allergies   . Myasthenia gravis (HCC)   . Prediabetes   . Pulmonary hypertension (HCC)   . Right ventricular dysfunction   . Sleep apnea   . TMJ (dislocation of temporomandibular joint)     Current Outpatient Medications  Medication Sig Dispense Refill  . albuterol (ACCUNEB) 1.25 MG/3ML nebulizer solution Take 3 mLs (1.25 mg total) by nebulization every 6 (six) hours as needed for wheezing. 75 mL 12  . albuterol (VENTOLIN HFA) 108 (90 Base) MCG/ACT inhaler Inhale 2 puffs into the lungs every 6 (six) hours as needed for wheezing or shortness of breath. 18 g 2  . benzonatate (TESSALON) 100 MG capsule TAKE 1 TO 2 CAPSULES(100 TO 200 MG) BY MOUTH THREE TIMES DAILY AS NEEDED FOR COUGH 90 capsule 0  . butalbital-aspirin-caffeine (FIORINAL) 50-325-40 MG per tablet Take 1 tablet by mouth 2 (two) times daily as needed for headache.    . candesartan (ATACAND) 8 MG tablet Take 8 mg by mouth daily.    Marland Kitchen ibuprofen (ADVIL,MOTRIN) 800 MG tablet     . levocetirizine (XYZAL) 5 MG tablet TAKE 1 TABLET(5 MG) BY MOUTH EVERY EVENING 90 tablet 3  .  meloxicam (MOBIC) 15 MG tablet Take 1 tablet (15 mg total) by mouth daily as needed for pain. Take with food. Do not take with other NSAIDs. 30 tablet 0  . montelukast (SINGULAIR) 10 MG tablet TAKE 1 TABLET(10 MG) BY MOUTH AT BEDTIME 90 tablet 1  . mycophenolate (CELLCEPT) 250 MG capsule     . mycophenolate (CELLCEPT) 500 MG tablet Take by mouth 2 (two) times daily.    . pantoprazole (PROTONIX) 40 MG tablet Take 1 tablet (40 mg total) by mouth daily. 90 tablet 3  . PREDNISONE PO Take 15 mg by mouth daily.     . riTUXimab-abbs  (Leroy IV) Inject into the vein.    . verapamil (VERELAN PM) 240 MG 24 hr capsule Take 240 mg by mouth at bedtime.    Marland Kitchen zonisamide (ZONEGRAN) 100 MG capsule Take 1 capsule (100 mg total) by mouth daily. (Patient taking differently: Take 200 mg by mouth daily. )     No current facility-administered medications for this visit.    Allergies  Allergen Reactions  . Aminoglycosides Other (See Comments)    Myasthenia Gravis alert Myasthenia Gravis alert   . Amoxicillin Swelling  . Beta Adrenergic Blockers Other (See Comments)    Myasthenia Gravis alert Myasthenia Gravis alert   . Bioflavonoids Hives  . Macrolides And Ketolides Other (See Comments)    Myasthenia Gravis alert Myasthenia Gravis alert   . Magnesium Anaphylaxis and Other (See Comments)    Myasthenia Gravis alert Myasthenia Gravis alert   . Mometasone Furo-Formoterol Fum Swelling    Patient tongue swells Patient tongue swells   . Onabotulinumtoxina Other (See Comments)    Myasthenia Gravis alert Myasthenia Gravis alert   . Penicillamine Other (See Comments)    Myasthenia Gravis alert  . Penicillins Swelling  . Povidone-Iodine Hives  . Procainamide Other (See Comments)    Myasthenia Gravis alert Myasthenia Gravis alert   . Quinolones Other (See Comments)    Myasthenia Gravis alert Myasthenia Gravis alert   . Relpax [Eletriptan] Swelling  . Sumatriptan Anaphylaxis    Other Reaction: throat closed  . Tubocurarine Other (See Comments)    Myasthenia Gravis alert Myasthenia Gravis alert   . Botox [Botulinum Toxin Type A]   . Iodine Hives  . Latex Hives  . Quinine Derivatives Swelling  . Saline     Metal sensation in mouth  . Sodium Chloride   . Zithromax [Azithromycin] Swelling    Social History   Socioeconomic History  . Marital status: Unknown    Spouse name: Not on file  . Number of children: Not on file  . Years of education: BA  . Highest education level: Not on file  Occupational History    . Occupation: [employer]   . Occupation: [employer]  Tobacco Use  . Smoking status: Never Smoker  . Smokeless tobacco: Never Used  Substance and Sexual Activity  . Alcohol use: No    Alcohol/week: 0.0 standard drinks  . Drug use: No  . Sexual activity: Not Currently    Birth control/protection: Condom    Comment: 1st intercourse 34 yo-Fewer than 5 partners  Other Topics Concern  . Not on file  Social History Narrative   Occasional Dr. Malachi Bonds   Social Determinants of Health   Financial Resource Strain:   . Difficulty of Paying Living Expenses:   Food Insecurity:   . Worried About Charity fundraiser in the Last Year:   . [place] in the  Last Year:   Transportation Needs:   . Freight forwarder (Medical):   Marland Kitchen Lack of Transportation (Non-Medical):   Physical Activity:   . Days of Exercise per Week:   . Minutes of Exercise per Session:   Stress:   . Feeling of Stress :   Social Connections:   . Frequency of Communication with Friends and Family:   . Frequency of Social Gatherings with Friends and Family:   . Attends Religious Services:   . Active Member of Clubs or Organizations:   . Attends Banker Meetings:   Marland Kitchen Marital Status:   Intimate Partner Violence:   . Fear of Current or Ex-Partner:   . Emotionally Abused:   Marland Kitchen Physically Abused:   . Sexually Abused:     ROS Review of Systems  Constitutional: Negative for activity change, appetite change, chills and fever.  HENT: Negative for congestion, nosebleeds, trouble swallowing and voice change.   Respiratory: Negative for cough, shortness of breath and wheezing.   Gastrointestinal: Negative for diarrhea, nausea and vomiting.  Genitourinary: Negative for difficulty urinating, dysuria, flank pain and hematuria.  Musculoskeletal: Negative for back pain, joint swelling and neck pain.  Neurological: Negative for dizziness, speech difficulty, light-headedness and numbness.  See HPI. All  other review of systems negative.   Objective  No vitals, telephone visit   Vitals as reported by the patient: There were no vitals filed for this visit.  Khrystina was seen today for medication refill.  Diagnoses and all orders for this visit:  Mild intermittent asthma without complication  discussed continuing meds  Finish covid vaccines   I discussed the assessment and treatment plan with the patient. The patient was provided an opportunity to ask questions and all were answered. The patient agreed with the plan and demonstrated an understanding of the instructions.   The patient was advised to call back or seek an in-person evaluation if the symptoms worsen or if the condition fails to improve as anticipated.  I provided 15 minutes of non-face-to-face time during this encounter.  Doristine Bosworth, MD  Primary Care at [facility]

## 2019-08-17 ENCOUNTER — Ambulatory Visit: Payer: Self-pay | Attending: Internal Medicine

## 2019-08-17 DIAGNOSIS — Z23 Encounter for immunization: Secondary | ICD-10-CM

## 2019-08-17 NOTE — Progress Notes (Signed)
   Covid-19 Vaccination Clinic  Name:  MILEA KLINK    MRN: [id number] DOB: 1985-06-22  08/17/2019  Ms. Wingler was observed post Covid-19 immunization for 15 minutes without incident. She was provided with Vaccine Information Sheet and instruction to access the V-Safe system.   Ms. Claw was instructed to call 911 with any severe reactions post vaccine: Marland Kitchen Difficulty breathing  . Swelling of face and throat  . A fast heartbeat  . A bad rash all over body  . Dizziness and weakness   Immunizations Administered    Name Date Dose VIS Date Route   Pfizer COVID-19 Vaccine 08/17/2019  4:54 PM 0.3 mL 06/03/2018 Intramuscular   Manufacturer: [employer], [employer]   Lot: [id number]   NDC: [address]-2

## 2019-10-10 ENCOUNTER — Other Ambulatory Visit: Payer: Self-pay | Admitting: Family Medicine

## 2019-10-10 DIAGNOSIS — J4520 Mild intermittent asthma, uncomplicated: Secondary | ICD-10-CM

## 2019-10-10 NOTE — Telephone Encounter (Signed)
Requested Prescriptions  Pending Prescriptions Disp Refills   albuterol (VENTOLIN HFA) 108 (90 Base) MCG/ACT inhaler [Pharmacy Med Name: ALBUTEROL HFA INH(200 PUFFS)18GM] 18 g 2    Sig: INHALE 2 PUFFS INTO THE LUNGS EVERY 6 HOURS AS NEEDED FOR WHEEZING OR SHORTNESS OF BREATH     Pulmonology:  Beta Agonists Failed - 10/10/2019  3:42 PM      Failed - One inhaler should last at least one month. If the patient is requesting refills earlier, contact the patient to check for uncontrolled symptoms.      Passed - Valid encounter within last 12 months    Recent Outpatient Visits          1 month ago Mild intermittent asthma without complication   Primary Care at [facility] [place] A, MD   10 months ago Mild intermittent asthma without complication   [facility] at [facility], Meda Coffee, MD   1 year ago Acute upper respiratory infection   Primary Care at [facility], Godfrey Pick, FNP   1 year ago Acute midline low back pain without sciatica   Primary Care at [facility], Manus Rudd, MD   1 year ago URI with cough and congestion   [facility] at [facility], Marolyn Hammock, [name]      Future Appointments            In 2 months Theresia Majors, MD [facility], [facility] [facility]

## 2019-12-11 ENCOUNTER — Encounter: Payer: BC Managed Care – PPO | Admitting: Obstetrics and Gynecology

## 2019-12-21 ENCOUNTER — Other Ambulatory Visit: Payer: Self-pay | Admitting: Family Medicine

## 2019-12-21 DIAGNOSIS — J4520 Mild intermittent asthma, uncomplicated: Secondary | ICD-10-CM

## 2019-12-21 NOTE — Telephone Encounter (Signed)
Requested medication (s) are due for refill today: no  Requested medication (s) are on the active medication list: yes   Last refill: 8/18/20211  Future visit scheduled:no  Notes to clinic:   One inhaler should last at least one month. If the patient is requesting refills earlier, contact the patient to check for uncontrolled symptoms  Requested Prescriptions  Pending Prescriptions Disp Refills   albuterol (VENTOLIN HFA) 108 (90 Base) MCG/ACT inhaler [Pharmacy Med Name: ALBUTEROL HFA INH(200 PUFFS)18GM] 18 g 2    Sig: INHALE 2 PUFFS INTO THE LUNGS EVERY 6 HOURS AS NEEDED FOR WHEEZING OR SHORTNESS OF BREATH      Pulmonology:  Beta Agonists Failed - 12/21/2019  3:43 AM      Failed - One inhaler should last at least one month. If the patient is requesting refills earlier, contact the patient to check for uncontrolled symptoms.      Passed - Valid encounter within last 12 months    Recent Outpatient Visits           4 months ago Mild intermittent asthma without complication   Primary Care at [facility] [place] A, MD   1 year ago Mild intermittent asthma without complication   [facility] at [facility], Meda Coffee, MD   1 year ago Acute upper respiratory infection   Primary Care at [facility], Godfrey Pick, FNP   1 year ago Acute midline low back pain without sciatica   Primary Care at [facility], Manus Rudd, MD   2 years ago URI with cough and congestion   Primary Care at [facility], Marolyn Hammock, [name]       Future Appointments             In 1 week Theresia Majors, MD [facility], [place] [facility]

## 2019-12-29 ENCOUNTER — Ambulatory Visit (INDEPENDENT_AMBULATORY_CARE_PROVIDER_SITE_OTHER): Payer: BC Managed Care – PPO | Admitting: Registered Nurse

## 2019-12-29 ENCOUNTER — Other Ambulatory Visit: Payer: Self-pay

## 2019-12-29 ENCOUNTER — Encounter: Payer: Self-pay | Admitting: Registered Nurse

## 2019-12-29 VITALS — BP 127/87 | HR 85 | Temp 97.9°F | Resp 18 | Ht 67.0 in | Wt 244.0 lb

## 2019-12-29 DIAGNOSIS — M25511 Pain in right shoulder: Secondary | ICD-10-CM | POA: Diagnosis not present

## 2019-12-29 MED ORDER — LIDOCAINE 5 % EX PTCH: 1.0000 | MEDICATED_PATCH | CUTANEOUS | 0 refills | Status: DC

## 2019-12-29 MED ORDER — GABAPENTIN 100 MG PO CAPS: 100.0000 mg | ORAL_CAPSULE | Freq: Three times a day (TID) | ORAL | 3 refills | Status: DC

## 2019-12-29 NOTE — Progress Notes (Signed)
Acute Office Visit  Subjective:    Patient ID: Sandy Baker, female    DOB: 1986/02/12, 34 y.o.   MRN: [id number]  Chief Complaint  Patient presents with  . Shoulder Pain    patient states she has been having right arm ans shoulder pain since sunday. 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 Acute respiratory failure (HCC)    With hypoxia and hypercapnea  . Allergic rhinitis   . Asthma   . Autoimmune disease (HCC)   . B12 deficiency   . Chiari I malformation (HCC)   . Congestive heart failure (HCC)   . Depression   . Fibromyalgia   . Heartburn   . Migraines   . Multiple food allergies   . Myasthenia gravis (HCC)   . Prediabetes   . Pulmonary hypertension (HCC)   . Right ventricular dysfunction   . Sleep apnea   . TMJ (dislocation of temporomandibular joint)     Past Surgical History:  Procedure Laterality Date  . CARDIAC CATHETERIZATION     05-2017  . THYMECTOMY      Family History  Problem Relation Age of Onset  . Hypertension Mother   . Depression Mother   . Asthma Mother   . Bipolar disorder Mother   . Thyroid disease Mother   . Obesity Mother   . Obesity Father   . Asthma Sister     Social History   Socioeconomic History  . Marital status: Unknown    Spouse name: Not on file  . Number of children: Not on file  . Years of education: BA  . Highest education level: Not on file  Occupational History  . Occupation: [employer]   . Occupation: [employer]  Tobacco Use  . Smoking status: Never Smoker  . Smokeless tobacco:  Never Used  Vaping Use  . Vaping Use: Never used  Substance and Sexual Activity  . Alcohol use: No    Alcohol/week: 0.0 standard drinks  . Drug use: No  . Sexual activity: Not Currently    Birth control/protection: Condom    Comment: 1st intercourse 34 yo-Fewer than 5 partners  Other Topics Concern  . Not on file  Social History Narrative   Occasional Dr. Reino Kent   Social Determinants of Health   Financial Resource Strain:   . Difficulty of Paying Living Expenses: Not on file  Food Insecurity:   . Worried About Programme researcher, broadcasting/film/video in the Last Year: Not on file  . Ran Out of Food in the Last Year: Not on file  Transportation Needs:   . Lack of Transportation (Medical): Not on file  . Lack of Transportation (Non-Medical): Not on file  Physical Activity:   . Days of Exercise per Week: Not on file  . Minutes of Exercise per Session: Not on file  Stress:   . Feeling of Stress : Not on file  Social Connections:   . Frequency of Communication with Friends and Family: Not on file  . Frequency of Social Gatherings with Friends and Family: Not  on file  . Attends Religious Services: Not on file  . Active Member of Clubs or Organizations: Not on file  . Attends BankerClub or Organization Meetings: Not on file  . Marital Status: Not on file  Intimate Partner Violence:   . Fear of Current or Ex-Partner: Not on file  . Emotionally Abused: Not on file  . Physically Abused: Not on file  . Sexually Abused: Not on file    Outpatient Medications Prior to Visit  Medication Sig Dispense Refill  . albuterol (ACCUNEB) 1.25 MG/3ML nebulizer solution Take 3 mLs (1.25 mg total) by nebulization every 6 (six) hours as needed for wheezing. 75 mL 12  . albuterol (VENTOLIN HFA) 108 (90 Base) MCG/ACT inhaler INHALE 2 PUFFS INTO THE LUNGS EVERY 6 HOURS AS NEEDED FOR WHEEZING OR SHORTNESS OF BREATH 18 g 2  . benzonatate (TESSALON) 100 MG capsule TAKE 1 TO 2 CAPSULES(100 TO 200 MG) BY MOUTH THREE TIMES DAILY AS  NEEDED FOR COUGH 90 capsule 0  . butalbital-aspirin-caffeine (FIORINAL) 50-325-40 MG per tablet Take 1 tablet by mouth 2 (two) times daily as needed for headache.    . candesartan (ATACAND) 8 MG tablet Take 8 mg by mouth daily.    Marland Kitchen. ibuprofen (ADVIL,MOTRIN) 800 MG tablet     . levocetirizine (XYZAL) 5 MG tablet TAKE 1 TABLET(5 MG) BY MOUTH EVERY EVENING 90 tablet 3  . meloxicam (MOBIC) 15 MG tablet Take 1 tablet (15 mg total) by mouth daily as needed for pain. Take with food. Do not take with other NSAIDs. 90 tablet 0  . montelukast (SINGULAIR) 10 MG tablet TAKE 1 TABLET(10 MG) BY MOUTH AT BEDTIME 90 tablet 1  . mycophenolate (CELLCEPT) 250 MG capsule     . mycophenolate (CELLCEPT) 500 MG tablet Take by mouth 2 (two) times daily.    . pantoprazole (PROTONIX) 40 MG tablet Take 1 tablet (40 mg total) by mouth daily. 90 tablet 3  . PREDNISONE PO Take 15 mg by mouth daily.     . riTUXimab-abbs (TRUXIMA IV) Inject into the vein.    . verapamil (VERELAN PM) 240 MG 24 hr capsule Take 240 mg by mouth at bedtime.    Marland Kitchen. zonisamide (ZONEGRAN) 100 MG capsule Take 1 capsule (100 mg total) by mouth daily. (Patient taking differently: Take 200 mg by mouth daily. )     No facility-administered medications prior to visit.    Allergies  Allergen Reactions  . Aminoglycosides Other (See Comments)    Myasthenia Gravis alert Myasthenia Gravis alert   . Amoxicillin Swelling  . Beta Adrenergic Blockers Other (See Comments)    Myasthenia Gravis alert Myasthenia Gravis alert   . Bioflavonoids Hives  . Macrolides And Ketolides Other (See Comments)    Myasthenia Gravis alert Myasthenia Gravis alert   . Magnesium Anaphylaxis and Other (See Comments)    Myasthenia Gravis alert Myasthenia Gravis alert   . Mometasone Furo-Formoterol Fum Swelling    Patient tongue swells Patient tongue swells   . Onabotulinumtoxina Other (See Comments)    Myasthenia Gravis alert Myasthenia Gravis alert   . Penicillamine  Other (See Comments)    Myasthenia Gravis alert  . Penicillins Swelling  . Povidone-Iodine Hives  . Procainamide Other (See Comments)    Myasthenia Gravis alert Myasthenia Gravis alert   . Quinolones Other (See Comments)    Myasthenia Gravis alert Myasthenia Gravis alert   . Relpax [Eletriptan] Swelling  . Sumatriptan Anaphylaxis    Other Reaction: throat closed  .  Tubocurarine Other (See Comments)    Myasthenia Gravis alert Myasthenia Gravis alert   . Botox [Botulinum Toxin Type A]   . Iodine Hives  . Latex Hives  . Quinine Derivatives Swelling  . Saline     Metal sensation in mouth  . Sodium Chloride   . Zithromax [Azithromycin] Swelling    Review of Systems Per hpi      Objective:    Physical Exam Nursing note reviewed.  Constitutional:      General: She is not in acute distress.    Appearance: Normal appearance. She is obese. She is not ill-appearing, toxic-appearing or diaphoretic.  HENT:     Head: Normocephalic and atraumatic.  Cardiovascular:     Rate and Rhythm: Normal rate and regular rhythm.  Pulmonary:     Effort: Pulmonary effort is normal. No respiratory distress.  Musculoskeletal:        General: Tenderness (anterior joint line R shoulder) present. No swelling, deformity or signs of injury. Normal range of motion.     Right lower leg: No edema.     Left lower leg: No edema.     Comments: Pain with flexion beyond 90 Pain with internal rotation Pain with resistance on bicep flexion  Neurological:     General: No focal deficit present.     Mental Status: She is alert and oriented to person, place, and time. Mental status is at baseline.  Psychiatric:        Mood and Affect: Mood normal.        Behavior: Behavior normal.        Thought Content: Thought content normal.        Judgment: Judgment normal.     BP 127/87   Pulse 85   Temp 97.9 F (36.6 C) (Temporal)   Resp 18   Ht 5\' 7"  (1.702 m)   Wt 244 lb (110.7 kg)   SpO2 100%   BMI  38.22 kg/m  Wt Readings from Last 3 Encounters:  12/29/19 244 lb (110.7 kg)  06/02/19 223 lb (101.2 kg)  05/20/19 224 lb (101.6 kg)    There are no preventive care reminders to display for this patient.  There are no preventive care reminders to display for this patient.   Lab Results  Component Value Date   TSH 0.85 12/28/2016   Lab Results  Component Value Date   WBC 11.0 (H) 12/28/2016   HGB 10.5 (L) 12/28/2016   HCT 36.7 12/28/2016   MCV 81.7 12/28/2016   PLT 287 12/28/2016   Lab Results  Component Value Date   NA 139 12/28/2015   K 4.0 12/28/2015   CO2 33 (H) 12/28/2015   GLUCOSE 92 12/28/2015   BUN 7 12/28/2015   CREATININE 0.52 12/28/2015   BILITOT 0.4 12/28/2015   ALKPHOS 38 12/28/2015   AST 17 12/28/2015   ALT 14 12/28/2015   PROT 6.3 12/28/2015   ALBUMIN 4.9 12/28/2015   CALCIUM 9.2 12/28/2015   No results found for: CHOL No results found for: HDL No results found for: LDLCALC No results found for: TRIG No results found for: CHOLHDL No results found for: 12/30/2015     Assessment & Plan:   Problem List Items Addressed This Visit    None    Visit Diagnoses    Acute pain of right shoulder    -  Primary   Relevant Medications   gabapentin (NEURONTIN) 100 MG capsule   lidocaine (LIDODERM) 5 %  Meds ordered this encounter  Medications  . gabapentin (NEURONTIN) 100 MG capsule    Sig: Take 1 capsule (100 mg total) by mouth 3 (three) times daily.    Dispense:  90 capsule    Refill:  3    Order Specific Question:   Supervising Provider    Answer:   Neva Seat, JEFFREY R [[id number]]  . lidocaine (LIDODERM) 5 %    Sig: Place 1 patch onto the skin daily. Remove & Discard patch within 12 hours or as directed by MD    Dispense:  30 patch    Refill:  0    Order Specific Question:   Supervising Provider    Answer:   Neva Seat, JEFFREY R [[id number]]   PLAN  Suspect rotator cuff tendonitis - given her current tx for myasthenia gravis, will use gabapentin and  lidocaine patches rather than steroids or further NSAIDs  Discussed follow up if needed  Patient encouraged to call clinic with any questions, comments, or concerns.   Janeece Agee, NP

## 2019-12-29 NOTE — Patient Instructions (Signed)
° ° ° °  If you have lab work done today you will be contacted with your lab results within the next 2 weeks.  If you have not heard from us then please contact us. The fastest way to get your results is to register for My Chart. ° ° °IF you received an x-ray today, you will receive an invoice from [facility]. Please contact [facility] at [phone] with questions or concerns regarding your invoice.  ° °IF you received labwork today, you will receive an invoice from [facility]. Please contact [facility] at [phone] with questions or concerns regarding your invoice.  ° °Our billing staff will not be able to assist you with questions regarding bills from these companies. ° °You will be contacted with the lab results as soon as they are available. The fastest way to get your results is to activate your My Chart account. Instructions are located on the last page of this paperwork. If you have not heard from us regarding the results in 2 weeks, please contact this office. °  ° ° ° °

## 2019-12-30 ENCOUNTER — Encounter: Payer: Self-pay | Admitting: Registered Nurse

## 2019-12-30 NOTE — Telephone Encounter (Signed)
12/30/2019 - PATIENT CALLED TO INQUIRE ABOUT THE LIDOCAINE PATCHES THAT RICH MORROW PRESCRIBED FOR HER YESTERDAY (Tuesday 12/29/2019). HER PHARMACY TOLD HER THAT OUR OFFICE NEEDS TO DO A PRIOR AUTHORIZATION BEFORE SHE CAN GET THEM. SHE WOULD LIKE A CALL BACK TO SEE WHERE IT IS IN THE PROCESS. BEST PHONE [phone]. MBC

## 2019-12-30 NOTE — Telephone Encounter (Signed)
Pt stated that she was advised to have her PCP call the number below to get the PA done faster / please advise   Call [employer] to get the PA done over the phone faster  @1  [phone]

## 2019-12-31 ENCOUNTER — Encounter: Payer: BC Managed Care – PPO | Admitting: Obstetrics and Gynecology

## 2019-12-31 ENCOUNTER — Telehealth: Payer: Self-pay | Admitting: *Deleted

## 2019-12-31 NOTE — Telephone Encounter (Signed)
Pa was denied message sent to [employer] [employer].

## 2019-12-31 NOTE — Telephone Encounter (Signed)
Sandy Baker,  The insurance will not cover lidocaine patches, unless patient has: Post herpetic neuralgia Diabetic neuropathy Cancer related neuropathy

## 2020-01-01 ENCOUNTER — Encounter: Payer: Self-pay | Admitting: Registered Nurse

## 2020-01-01 ENCOUNTER — Telehealth: Payer: Self-pay | Admitting: *Deleted

## 2020-01-01 NOTE — Telephone Encounter (Signed)
Pt needs an appeal for medication the pt previously had shingles which is a covered Dx

## 2020-01-01 NOTE — Telephone Encounter (Signed)
Spoke with patient she will come sign paperwork for PA appeal for Patches.    Informed patient I did resend another PA all over with the new information.  If this one doesn't get approved we will send paperwork.

## 2020-01-04 ENCOUNTER — Other Ambulatory Visit: Payer: Self-pay

## 2020-01-04 ENCOUNTER — Encounter: Payer: Self-pay | Admitting: Obstetrics and Gynecology

## 2020-01-04 ENCOUNTER — Telehealth: Payer: Self-pay | Admitting: *Deleted

## 2020-01-04 ENCOUNTER — Encounter: Payer: Self-pay | Admitting: *Deleted

## 2020-01-04 ENCOUNTER — Ambulatory Visit (INDEPENDENT_AMBULATORY_CARE_PROVIDER_SITE_OTHER): Payer: BC Managed Care – PPO | Admitting: Obstetrics and Gynecology

## 2020-01-04 VITALS — BP 124/80 | Ht 67.0 in | Wt 242.0 lb

## 2020-01-04 DIAGNOSIS — N6320 Unspecified lump in the left breast, unspecified quadrant: Secondary | ICD-10-CM

## 2020-01-04 DIAGNOSIS — N6325 Unspecified lump in the left breast, overlapping quadrants: Secondary | ICD-10-CM

## 2020-01-04 DIAGNOSIS — Z01411 Encounter for gynecological examination (general) (routine) with abnormal findings: Secondary | ICD-10-CM

## 2020-01-04 NOTE — Telephone Encounter (Signed)
Patient scheduled at the breast center on 01/19/20 @ 8:45am.

## 2020-01-04 NOTE — Progress Notes (Signed)
Sandy Baker [birth year] [id number]  SUBJECTIVE:  34 y.o. G0P0 female for annual routine gynecologic exam. She has no gynecologic concerns. Finishing Masters degree in IT.  Current Outpatient Medications  Medication Sig Dispense Refill  . albuterol (ACCUNEB) 1.25 MG/3ML nebulizer solution Take 3 mLs (1.25 mg total) by nebulization every 6 (six) hours as needed for wheezing. 75 mL 12  . albuterol (VENTOLIN HFA) 108 (90 Base) MCG/ACT inhaler INHALE 2 PUFFS INTO THE LUNGS EVERY 6 HOURS AS NEEDED FOR WHEEZING OR SHORTNESS OF BREATH 18 g 2  . benzonatate (TESSALON) 100 MG capsule TAKE 1 TO 2 CAPSULES(100 TO 200 MG) BY MOUTH THREE TIMES DAILY AS NEEDED FOR COUGH 90 capsule 0  . butalbital-aspirin-caffeine (FIORINAL) 50-325-40 MG per tablet Take 1 tablet by mouth 2 (two) times daily as needed for headache.    . candesartan (ATACAND) 8 MG tablet Take 8 mg by mouth daily.    Marland Kitchen gabapentin (NEURONTIN) 100 MG capsule Take 1 capsule (100 mg total) by mouth 3 (three) times daily. 90 capsule 3  . ibuprofen (ADVIL,MOTRIN) 800 MG tablet     . levocetirizine (XYZAL) 5 MG tablet TAKE 1 TABLET(5 MG) BY MOUTH EVERY EVENING 90 tablet 3  . lidocaine (LIDODERM) 5 % Place 1 patch onto the skin daily. Remove & Discard patch within 12 hours or as directed by MD 30 patch 0  . meloxicam (MOBIC) 15 MG tablet Take 1 tablet (15 mg total) by mouth daily as needed for pain. Take with food. Do not take with other NSAIDs. 90 tablet 0  . montelukast (SINGULAIR) 10 MG tablet TAKE 1 TABLET(10 MG) BY MOUTH AT BEDTIME 90 tablet 1  . mycophenolate (CELLCEPT) 250 MG capsule     . mycophenolate (CELLCEPT) 500 MG tablet Take by mouth 2 (two) times daily.    . pantoprazole (PROTONIX) 40 MG tablet Take 1 tablet (40 mg total) by mouth daily. 90 tablet 3  . PREDNISONE PO Take 15 mg by mouth daily.     . riTUXimab-abbs (TRUXIMA IV) Inject into the vein.    . verapamil (VERELAN PM) 240 MG 24 hr capsule Take 240 mg by mouth at bedtime.     Marland Kitchen zonisamide (ZONEGRAN) 100 MG capsule Take 1 capsule (100 mg total) by mouth daily. (Patient taking differently: Take 200 mg by mouth daily. )     No current facility-administered medications for this visit.   Allergies: Aminoglycosides, Amoxicillin, Beta adrenergic blockers, Bioflavonoids, Macrolides and ketolides, Magnesium, Mometasone furo-formoterol fum, Onabotulinumtoxina, Penicillamine, Penicillins, Povidone-iodine, Procainamide, Quinolones, Relpax [eletriptan], Sumatriptan, Tubocurarine, Botox [botulinum toxin type a], Iodine, Latex, Quinine derivatives, Saline, Sodium chloride, and Zithromax [azithromycin]  Patient's last menstrual period was 12/21/2019.   Past medical history,surgical history, problem list, medications, allergies, family history and social history were all reviewed and documented as reviewed in the EPIC chart.  ROS:  Feeling well. No dyspnea or chest pain on exertion.  No abdominal pain, change in bowel habits, black or bloody stools.  No urinary tract symptoms. GYN ROS: normal menses, no abnormal bleeding, pelvic pain or discharge, no breast pain or new or enlarging lumps on self exam. No neurological complaints.   OBJECTIVE:  BP 124/80 (Cuff Size: Large)   Ht 5\' 7"  (1.702 m)   Wt 242 lb (109.8 kg)   LMP 12/21/2019   BMI 37.90 kg/m  The patient appears well, alert, oriented x 3, in no distress. ENT normal. Abdomen soft without tenderness, guarding, mass or organomegaly.  Neurological is normal, no  focal findings.  BREAST EXAM: breasts appear normal, soft mobile lump 1 cm diameter at 6:00 just outside of areola inferiorly on left breast, no other suspicious masses, no skin or nipple changes or axillary nodes  PELVIC EXAM: VULVA: normal appearing vulva with no masses, tenderness or lesions, VAGINA: normal appearing vagina with normal color and discharge, no lesions, CERVIX: normal appearing cervix without discharge or lesions, UTERUS: uterus is normal size,  shape, consistency and nontender, ADNEXA: normal adnexa in size, nontender and no masses, RECTAL: declined by the patient  Chaperone: Kennon Portela present during the examination  ASSESSMENT:  [name]  PLAN:   1. No hormonal or menstrual concerns.  2. Pap smear 2019.  No history of abnormal Pap smears.  Next Pap smear due 2022 following the current guidelines recommending the 3 year interval. 3. Contraception. Not sexually active and not planning to be. 4. Left breast lump. Probably a benign cyst but will have our office arrange imaging to make sure. Otherwise normal breast exam. Patient not really doing SBE but has noticed slow continued growth of both breasts. 5. Health maintenance.  No labs today as she normally has these completed elsewhere.  Return annually or sooner, prn.  Sandy Majors MD 01/04/20

## 2020-01-04 NOTE — Telephone Encounter (Signed)
Order placed at breast center. 

## 2020-01-04 NOTE — Telephone Encounter (Signed)
-----   Message from Theresia Majors, MD sent at 01/04/2020  8:35 AM EDT ----- Regarding: left breast ultrasound Good morning, can we please have her get a left breast ultrasound for a lump I detected on exam today?

## 2020-01-19 ENCOUNTER — Other Ambulatory Visit: Payer: Self-pay | Admitting: Obstetrics and Gynecology

## 2020-01-19 ENCOUNTER — Ambulatory Visit
Admission: RE | Admit: 2020-01-19 | Discharge: 2020-01-19 | Disposition: A | Discharge status: Home / Self Care | Payer: BC Managed Care – PPO | Source: Ambulatory Visit | Attending: Obstetrics and Gynecology | Admitting: Obstetrics and Gynecology

## 2020-01-19 ENCOUNTER — Other Ambulatory Visit: Payer: Self-pay

## 2020-01-19 DIAGNOSIS — N6320 Unspecified lump in the left breast, unspecified quadrant: Secondary | ICD-10-CM

## 2020-01-23 ENCOUNTER — Other Ambulatory Visit: Payer: Self-pay

## 2020-01-23 ENCOUNTER — Ambulatory Visit
Admission: RE | Admit: 2020-01-23 | Discharge: 2020-01-23 | Disposition: A | Discharge status: Home / Self Care | Payer: BC Managed Care – PPO | Source: Ambulatory Visit | Attending: Student | Admitting: Student

## 2020-01-23 ENCOUNTER — Ambulatory Visit: Payer: BC Managed Care – PPO

## 2020-01-23 VITALS — BP 139/85 | HR 102 | Temp 98.2°F | Resp 18 | Ht 67.0 in | Wt 245.0 lb

## 2020-01-23 DIAGNOSIS — S63501A Unspecified sprain of right wrist, initial encounter: Secondary | ICD-10-CM

## 2020-01-23 DIAGNOSIS — S62001A Unspecified fracture of navicular [scaphoid] bone of right wrist, initial encounter for closed fracture: Secondary | ICD-10-CM

## 2020-01-23 DIAGNOSIS — S52124A Nondisplaced fracture of head of right radius, initial encounter for closed fracture: Secondary | ICD-10-CM

## 2020-01-23 MED ORDER — HYDROCODONE-ACETAMINOPHEN 5-325 MG PO TABS: 1.0000 | ORAL_TABLET | Freq: Four times a day (QID) | ORAL | 0 refills | Status: DC | PRN

## 2020-01-23 NOTE — Discharge Instructions (Addendum)
Remain in sling and brace until follow-up appointment. Ice the right arm and OTC ibuprofen as needed Norco for breakthrough pain. Follow-up with Orthopaedics

## 2020-01-23 NOTE — ED Triage Notes (Signed)
Patient in today after tripping and falling yesterday evening. Patient c/o right wrist, forearm and elbow pain. Patient took Ibuprofen 800mg  last night and applied a lidocaine patch.

## 2020-01-23 NOTE — ED Provider Notes (Signed)
[facility]    CSN: [id number] Arrival date & time: 01/23/20  8250      History   Chief Complaint Chief Complaint  Patient presents with  . Fall    DOI 01/22/20  . Wrist Pain    right  . Arm Pain  . Elbow Pain    HPI Sandy Baker is a 34 y.o. female who presents today after suffering a fall last night.  The patient tripped and fell, she did not land on outstretched arm but she does believe that her arm was at her side and she landed with her palm down on the ground in her wrist in a hyperextended position.  She is right-hand dominant.  She denies any surgical history to the right upper extremity.  She does have a history of myasthenia gravis.  She did suffer some superficial abrasions to bilateral knees but her primary pain is in the right arm.  She was experiencing pain both in the right wrist, along the forearm into the right elbow.  She has noticed some decreased motion with her elbow and wrist and increased swelling.  Denies any open wound.  She denies any new onset numbness or tingling to the right upper extremity.  She has been taking ibuprofen at home and also applied over-the-counter lidocaine patches with mild relief of her symptoms.  Presents today for further evaluation.  HPI  Past Medical History:  Diagnosis Date  . Acute respiratory failure (HCC)    With hypoxia and hypercapnea  . Allergic rhinitis   . Asthma   . Autoimmune disease (HCC)   . B12 deficiency   . Chiari I malformation (HCC)   . Congestive heart failure (HCC)   . Depression   . Fibromyalgia   . Heartburn   . Migraines   . Multiple food allergies   . Myasthenia gravis (HCC)   . Prediabetes   . Pulmonary hypertension (HCC)   . Right ventricular dysfunction   . Sleep apnea   . TMJ (dislocation of temporomandibular joint)     Patient Active Problem List   Diagnosis Date Noted  . Tachycardia 01/20/2016  . 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 Dysarthria 12/26/2015  .  Migraine without aura and without status migrainosus, not intractable 12/21/2015  . Obstructive sleep apnea syndrome 11/05/2014  . GERD (gastroesophageal reflux disease) 09/12/2013  . Asthma without status asthmaticus 09/09/2013  . Fibromyalgia 09/09/2013  . Allergic rhinitis 09/09/2013  . Myasthenia gravis (HCC) 06/19/2011    Past Surgical History:  Procedure Laterality Date  . CARDIAC CATHETERIZATION     05-2017  . THYMECTOMY      OB History    Gravida  0   Para      Term      Preterm      AB      Living        SAB      TAB      Ectopic      Multiple      Live Births               Home Medications    Prior to Admission medications   Medication Sig Start Date End Date Taking? Authorizing Provider  albuterol (ACCUNEB) 1.25 MG/3ML nebulizer solution Take 3 mLs (1.25 mg total) by nebulization every 6 (six) hours as needed for wheezing. 11/21/18  Yes Myles Lipps, MD  albuterol (VENTOLIN HFA) 108 (90 Base) MCG/ACT inhaler INHALE 2 PUFFS INTO THE  LUNGS EVERY 6 HOURS AS NEEDED FOR WHEEZING OR SHORTNESS OF BREATH 12/21/19  Yes Myles LippsSantiago, Irma M, MD  benzonatate (TESSALON) 100 MG capsule TAKE 1 TO 2 CAPSULES(100 TO 200 MG) BY MOUTH THREE TIMES DAILY AS NEEDED FOR COUGH 04/03/17  Yes Wallis BambergMani, [name], [name]  butalbital-aspirin-caffeine [facility](FIORINAL) 50-325-40 MG per tablet Take 1 tablet by mouth 2 (two) times daily as needed for headache.   Yes [provider]  candesartan (ATACAND) 8 MG tablet Take 8 mg by mouth daily.   Yes [provider]  gabapentin (NEURONTIN) 100 MG capsule Take 1 capsule (100 mg total) by mouth 3 (three) times daily. 12/29/19  Yes Janeece AgeeMorrow, Richard, NP  ibuprofen (ADVIL,MOTRIN) 800 MG tablet  04/03/17  Yes [provider]  levocetirizine (XYZAL) 5 MG tablet TAKE 1 TABLET(5 MG) BY MOUTH EVERY EVENING 11/21/18  Yes Myles LippsSantiago, Irma M, MD  lidocaine (LIDODERM) 5 % Place 1 patch onto the skin daily. Remove & Discard patch within 12  hours or as directed by MD 12/29/19  Yes Janeece AgeeMorrow, Richard, NP  meloxicam (MOBIC) 15 MG tablet Take 1 tablet (15 mg total) by mouth daily as needed for pain. Take with food. Do not take with other NSAIDs. 08/14/19  Yes Stallings, [name], MD  montelukast (SINGULAIR) 10 MG tablet TAKE 1 TABLET(10 MG) BY MOUTH AT BEDTIME 08/14/19  Yes Collie SiadStallings, [name], MD  mycophenolate (CELLCEPT) 250 MG capsule  08/19/14  Yes [provider]  mycophenolate (CELLCEPT) 500 MG tablet Take by mouth 2 (two) times daily.   Yes [provider]  pantoprazole (PROTONIX) 40 MG tablet Take 1 tablet (40 mg total) by mouth daily. 08/14/17  Yes [name], Michael L, [name]  PREDNISONE PO Take 15 mg by mouth daily.    Yes [provider]  riTUXimab-abbs (TRUXIMA IV) Inject into the vein.   Yes [provider]  verapamil (VERELAN PM) 240 MG 24 hr capsule Take 240 mg by mouth at bedtime.   Yes [provider]  zonisamide (ZONEGRAN) 100 MG capsule Take 1 capsule (100 mg total) by mouth daily. Patient taking differently: Take 200 mg by mouth daily.  10/09/16  Yes Trena PlattEnglish, Stephanie D, PA  HYDROcodone-acetaminophen (NORCO/VICODIN) 5-325 MG tablet Take 1 tablet by mouth every 6 (six) hours as needed. 01/23/20   Anson OregonMcGhee, Areg Bialas Lance, [name]    Family History Family History  Problem Relation Age of Onset  . Hypertension Mother   . Depression Mother   . Asthma Mother   . Bipolar disorder Mother   . Thyroid disease Mother   . Obesity Mother   . Obesity Father   . Asthma Sister   . Breast cancer Cousin     Social History Social History   Tobacco Use  . Smoking status: Never Smoker  . Smokeless tobacco: Never Used  Vaping Use  . Vaping Use: Never used  Substance Use Topics  . Alcohol use: No    Alcohol/week: 0.0 standard drinks  . Drug use: No     Allergies   Aminoglycosides, Amoxicillin, Beta adrenergic blockers, Bioflavonoids, Macrolides and ketolides, Magnesium, Mometasone furo-formoterol  fum, Onabotulinumtoxina, Penicillamine, Penicillins, Povidone-iodine, Procainamide, Quinolones, Relpax [eletriptan], Sumatriptan, Tubocurarine, Botox [botulinum toxin type a], Iodine, Latex, Quinine derivatives, Saline, Sodium chloride, and Zithromax [azithromycin]   Review of Systems Review of Systems  Constitutional: Negative.   HENT: Negative.   Eyes: Negative.   Respiratory: Negative.   Cardiovascular: Negative.   Gastrointestinal: Negative.   Endocrine: Negative.   Genitourinary: Negative.  Musculoskeletal: Positive for arthralgias, joint swelling and myalgias.  Skin: Negative.   Allergic/Immunologic: Negative.   Neurological: Negative.   Hematological: Negative.   Psychiatric/Behavioral: Negative.    Physical Exam Triage Vital Signs ED Triage Vitals  Enc Vitals Group     BP 01/23/20 0855 139/85     Pulse Rate 01/23/20 0855 (!) 102     Resp 01/23/20 0855 18     Temp 01/23/20 0855 98.2 F (36.8 C)     Temp Source 01/23/20 0855 Oral     SpO2 01/23/20 0855 100 %     Weight 01/23/20 0855 245 lb (111.1 kg)     Height 01/23/20 0855 5\' 7"  (1.702 m)     Head Circumference --      Peak Flow --      Pain Score 01/23/20 0854 9     Pain Loc --      Pain Edu? --      Excl. in GC? --    No data found.  Updated Vital Signs BP 139/85 (BP Location: Left Arm)   Pulse (!) 102   Temp 98.2 F (36.8 C) (Oral)   Resp 18   Ht 5\' 7"  (1.702 m)   Wt 245 lb (111.1 kg)   LMP 01/19/2020 (Exact Date)   SpO2 100%   BMI 38.37 kg/m   Visual Acuity Right Eye Distance:   Left Eye Distance:   Bilateral Distance:    Right Eye Near:   Left Eye Near:    Bilateral Near:     Physical Exam Skin examination of the right forearm does reveal generalized swelling to the right wrist forearm up to the elbow.  There is no open wound or evidence of infection.  The patient is tender to palpation over the anatomic snuffbox and over the distal radius.  The patient is able to gently flex and extend  the right wrist with mild discomfort.  The patient does have mild tenderness to palpation along the medial aspect of elbow, significant tenderness to palpation over the radial head of the right elbow.  She is able to gently pronate but has significant discomfort with supination of the right forearm, she is able to flex the right elbow 110 degrees of moderate pain, extension -30 degrees with moderate pain.  The right elbow is stable to varus and valgus stress testing at today's visit.  The patient is intact light touch of the right upper extremity.  Cap refill is intact to each individual digit.  Radial pulses intact to the right wrist.  UC Treatments / Results  Labs (all labs ordered are listed, but only abnormal results are displayed) Labs Reviewed - No data to display  EKG   Radiology DG Elbow Complete Right  Result Date: 01/23/2020 CLINICAL DATA:  Post fall, now with wrist, forearm and elbow pain. EXAM: RIGHT ELBOW - COMPLETE 3+ VIEW COMPARISON:  Right forearm radiographs-earlier same day FINDINGS: Note is made of an elbow joint effusion. This finding is associated with a potential nondisplaced vertical fracture of the lateral aspect of the radial head. No additional displaced fractures are identified. Joint spaces are preserved. Regional soft tissues appear normal. No radiopaque foreign body. IMPRESSION: Elbow joint effusion with potential nondisplaced fracture of the radial head. Correlation with point tenderness at this location is recommended. Electronically Signed   By: 03/20/2020 M.D.   On: 01/23/2020 09:49   DG Forearm Right  Result Date: 01/23/2020 CLINICAL DATA:  Post fall, now with right wrist  and elbow pain. EXAM: RIGHT FOREARM - 2 VIEW COMPARISON:  Right wrist and elbow radiographs-earlier same day FINDINGS: Redemonstrated elbow joint effusion though questioned nondisplaced fracture of the radial head is not definitely seen on the present examination. No additional fractures  identified. Limited visualization of the wrist is normal given obliquity and large field of view. Regional soft tissues appear normal. No radiopaque foreign body. IMPRESSION: 1. Redemonstrated elbow joint effusion though questioned nondisplaced fracture of the radial head demonstrated on dedicated elbow radiographs performed earlier same day is not definitely seen on the present examination. 2. No additional fractures identified. Electronically Signed   By: Simonne Come M.D.   On: 01/23/2020 09:51   DG Wrist Complete Right  Result Date: 01/23/2020 CLINICAL DATA:  Post fall, now with wrist, forearm and elbow pain. EXAM: RIGHT WRIST - COMPLETE 3+ VIEW COMPARISON:  Right forearm radiographs-earlier same day FINDINGS: No fracture or dislocation. Joint spaces are preserved. No erosions. No evidence of chondrocalcinosis. Regional soft tissues appear normal. IMPRESSION: No fracture. If the patient has pain referable to the anatomic snuff box, splinting and a follow-up radiograph in 10 to 14 days is recommended to evaluate for occult scaphoid fracture. Electronically Signed   By: Simonne Come M.D.   On: 01/23/2020 09:47    Procedures Procedures (including critical care time)  Medications Ordered in UC Medications - No data to display  Initial Impression / Assessment and Plan / UC Course  I have reviewed the triage vital signs and the nursing notes.  Pertinent labs & imaging results that were available during my care of the patient were reviewed by me and considered in my medical decision making (see chart for details).     1.  Treatment options were discussed today with the patient. 2.  The patient does have what appears to be a nondisplaced radial head fracture, she was placed in a shoulder sling and was instructed to remain in this until her next follow-up appointment.  She will contact orthopedics on Monday to schedule follow-up. 3.  I believe her wrist pain is likely due to wrist sprain from her  fall however the patient is tender to palpation in the anatomic snuffbox.  The patient was placed in a thumb spica wrist splint as a precaution. 4.  The prescribed Norco as needed for breakthrough pain, continue to ice the right upper extremity and over-the-counter medications for discomfort. 5.  The patient will follow up with orthopedics. Final Clinical Impressions(s) / UC Diagnoses   Final diagnoses:  Sprain of right wrist, initial encounter  Closed nondisplaced fracture of scaphoid of right wrist, unspecified portion of scaphoid, initial encounter  Closed nondisplaced fracture of head of right radius, initial encounter     Discharge Instructions     Remain in sling and brace until follow-up appointment. Ice the right arm and OTC ibuprofen as needed Norco for breakthrough pain. Follow-up with Orthopaedics    ED Prescriptions    Medication Sig Dispense Auth. Provider   HYDROcodone-acetaminophen (NORCO/VICODIN) 5-325 MG tablet Take 1 tablet by mouth every 6 (six) hours as needed. 12 tablet Anson Oregon, [place]     I have reviewed the PDMP during this encounter.   Anson [place], [name] 01/23/20 1013

## 2020-01-27 ENCOUNTER — Ambulatory Visit
Admission: RE | Admit: 2020-01-27 | Discharge: 2020-01-27 | Disposition: A | Discharge status: Home / Self Care | Payer: BC Managed Care – PPO | Source: Ambulatory Visit | Attending: Obstetrics and Gynecology | Admitting: Obstetrics and Gynecology

## 2020-01-27 ENCOUNTER — Other Ambulatory Visit: Payer: Self-pay

## 2020-01-27 DIAGNOSIS — N6320 Unspecified lump in the left breast, unspecified quadrant: Secondary | ICD-10-CM

## 2020-01-29 ENCOUNTER — Other Ambulatory Visit: Payer: Self-pay | Admitting: Obstetrics and Gynecology

## 2020-01-29 DIAGNOSIS — N63 Unspecified lump in unspecified breast: Secondary | ICD-10-CM

## 2020-02-04 ENCOUNTER — Ambulatory Visit: Payer: BC Managed Care – PPO | Attending: Internal Medicine

## 2020-02-04 DIAGNOSIS — Z23 Encounter for immunization: Secondary | ICD-10-CM

## 2020-02-04 NOTE — Progress Notes (Signed)
   Covid-19 Vaccination Clinic  Name:  Sandy Baker    MRN: [id number] DOB: 1985/10/16  02/04/2020  Sandy Baker was observed post Covid-19 immunization for 15 minutes without incident. She was provided with Vaccine Information Sheet and instruction to access the V-Safe system.   Sandy Baker was instructed to call 911 with any severe reactions post vaccine: Marland Kitchen Difficulty breathing  . Swelling of face and throat  . A fast heartbeat  . A bad rash all over body  . Dizziness and weakness

## 2020-02-08 ENCOUNTER — Other Ambulatory Visit: Payer: Self-pay | Admitting: Registered Nurse

## 2020-02-08 DIAGNOSIS — J301 Allergic rhinitis due to pollen: Secondary | ICD-10-CM

## 2020-02-08 MED ORDER — LEVOCETIRIZINE DIHYDROCHLORIDE 5 MG PO TABS: ORAL_TABLET | ORAL | 3 refills | Status: DC

## 2020-02-08 NOTE — Telephone Encounter (Signed)
Copied from CRM [id number]. Topic: Quick Communication - Rx Refill/Question >> Feb 08, 2020  9:01 AM Marylen Ponto wrote: Medication: levocetirizine (XYZAL) 5 MG tablet  Has the patient contacted their pharmacy? yes   Preferred Pharmacy (with phone number or street name): [id number] [place], [place] - [address] AT [facility] & [address] Phone: [phone]  Fax: [phone]  Agent: Please be advised that RX refills may take up to 3 business days. We ask that you follow-up with your pharmacy.

## 2020-02-11 ENCOUNTER — Other Ambulatory Visit: Payer: Self-pay | Admitting: Registered Nurse

## 2020-02-11 DIAGNOSIS — J4520 Mild intermittent asthma, uncomplicated: Secondary | ICD-10-CM

## 2020-02-11 NOTE — Telephone Encounter (Signed)
Per agent.... "Pt said she has called pharm and was told by pharmacist they contacted the office 3x. Pt is going out of town tomorrow and would like a refill on albuterol nebulizer solution. [facility] [employer].  Pt last seen in sept 2021. Pt does not have a future appt."  Med has expired. LRF 11/21/2018, 76ml  12 refills. Dr. Leretha Pol.  Please review.

## 2020-02-11 NOTE — Telephone Encounter (Signed)
Pt said she has called pharm and was told by pharmacist they contacted the office 3x. Pt is going out of town tomorrow and would like a refill on albuterol nebulizer solution. [facility] [address]. Pt last seen in sept 2021. Pt does not have a future appt

## 2020-02-12 MED ORDER — ALBUTEROL SULFATE 1.25 MG/3ML IN NEBU: 1.0000 | INHALATION_SOLUTION | Freq: Four times a day (QID) | RESPIRATORY_TRACT | 12 refills | Status: DC | PRN

## 2020-07-29 ENCOUNTER — Ambulatory Visit
Admission: RE | Admit: 2020-07-29 | Discharge: 2020-07-29 | Disposition: A | Discharge status: Home / Self Care | Payer: BC Managed Care – PPO | Source: Ambulatory Visit | Attending: Obstetrics and Gynecology | Admitting: Obstetrics and Gynecology

## 2020-07-29 ENCOUNTER — Other Ambulatory Visit: Payer: Self-pay

## 2020-07-29 ENCOUNTER — Other Ambulatory Visit: Payer: Self-pay | Admitting: Obstetrics and Gynecology

## 2020-07-29 DIAGNOSIS — N63 Unspecified lump in unspecified breast: Secondary | ICD-10-CM

## 2020-12-05 DIAGNOSIS — G90512 Complex regional pain syndrome I of left upper limb: Secondary | ICD-10-CM | POA: Insufficient documentation

## 2020-12-05 HISTORY — PX: ULNAR COLLATERAL LIGAMENT REPAIR: SHX6159

## 2021-01-04 ENCOUNTER — Ambulatory Visit (INDEPENDENT_AMBULATORY_CARE_PROVIDER_SITE_OTHER): Payer: BC Managed Care – PPO | Admitting: Obstetrics & Gynecology

## 2021-01-04 ENCOUNTER — Encounter: Payer: Self-pay | Admitting: Obstetrics & Gynecology

## 2021-01-04 ENCOUNTER — Encounter: Payer: BC Managed Care – PPO | Admitting: Obstetrics and Gynecology

## 2021-01-04 ENCOUNTER — Other Ambulatory Visit (HOSPITAL_COMMUNITY)
Admission: RE | Admit: 2021-01-04 | Discharge: 2021-01-04 | Disposition: A | Discharge status: Home / Self Care | Payer: BC Managed Care – PPO | Source: Ambulatory Visit | Attending: Obstetrics & Gynecology | Admitting: Obstetrics & Gynecology

## 2021-01-04 ENCOUNTER — Other Ambulatory Visit: Payer: Self-pay

## 2021-01-04 VITALS — BP 114/74 | HR 78 | Resp 16 | Ht 66.75 in | Wt 258.0 lb

## 2021-01-04 DIAGNOSIS — Z01419 Encounter for gynecological examination (general) (routine) without abnormal findings: Secondary | ICD-10-CM

## 2021-01-04 DIAGNOSIS — Z6841 Body Mass Index (BMI) 40.0 and over, adult: Secondary | ICD-10-CM

## 2021-01-04 DIAGNOSIS — Z308 Encounter for other contraceptive management: Secondary | ICD-10-CM

## 2021-01-04 NOTE — Addendum Note (Signed)
Addended by: Genia Del on: 01/04/2021 09:58 AM   Modules accepted: Orders

## 2021-01-04 NOTE — Progress Notes (Addendum)
Sandy Baker 1986-01-05 [id number]   History:    35 y.o. G0  RP:  Established patient presenting for annual gyn exam   HPI:  Menses regular normal every month.  No BTB. Abstinent.  Pap smear Neg 2019.  No history of abnormal Pap smears.  Breasts normal now. PASH Left Breast 01/2021.  BMI 40.71.  Followed at [facility] for Myasthenia Gravis.    Past medical history,surgical history, family history and social history were all reviewed and documented in the EPIC chart.  Gynecologic History Patient's last menstrual period was 12/19/2020 (exact date).  Obstetric History OB History  Gravida Para Term Preterm AB Living  0            SAB IAB Ectopic Multiple Live Births                ROS: A ROS was performed and pertinent positives and negatives are included in the history.  GENERAL: No fevers or chills. HEENT: No change in vision, no earache, sore throat or sinus congestion. NECK: No pain or stiffness. CARDIOVASCULAR: No chest pain or pressure. No palpitations. PULMONARY: No shortness of breath, cough or wheeze. GASTROINTESTINAL: No abdominal pain, nausea, vomiting or diarrhea, melena or bright red blood per rectum. GENITOURINARY: No urinary frequency, urgency, hesitancy or dysuria. MUSCULOSKELETAL: No joint or muscle pain, no back pain, no recent trauma. DERMATOLOGIC: No rash, no itching, no lesions. ENDOCRINE: No polyuria, polydipsia, no heat or cold intolerance. No recent change in weight. HEMATOLOGICAL: No anemia or easy bruising or bleeding. NEUROLOGIC: No headache, seizures, numbness, tingling or weakness. PSYCHIATRIC: No depression, no loss of interest in normal activity or change in sleep pattern.     Exam:   BP 114/74   Pulse 78   Resp 16   Ht 5' 6.75" (1.695 m)   Wt 258 lb (117 kg)   LMP 12/19/2020 (Exact Date)   BMI 40.71 kg/m   Body mass index is 40.71 kg/m.  General appearance : Well developed well nourished female. No acute distress HEENT: Eyes: no retinal  hemorrhage or exudates,  Neck supple, trachea midline, no carotid bruits, no thyroidmegaly Lungs: Clear to auscultation, no rhonchi or wheezes, or rib retractions  Heart: Regular rate and rhythm, no murmurs or gallops Breast:Examined in sitting and supine position were symmetrical in appearance, no palpable masses or tenderness,  no skin retraction, no nipple inversion, no nipple discharge, no skin discoloration, no axillary or supraclavicular lymphadenopathy Abdomen: no palpable masses or tenderness, no rebound or guarding Extremities: no edema or skin discoloration or tenderness  Pelvic: Vulva: Normal             Vagina: No gross lesions or discharge  Cervix: No gross lesions or discharge.  Pap reflex done.  Uterus  AV, normal size, shape and consistency, non-tender and mobile  Adnexa  Without masses or tenderness  Anus: Normal   Assessment/Plan:  35 y.o. female for annual exam   1. Encounter for routine gynecological examination with Papanicolaou smear of cervix Normal gynecologic exam.  Pap reflex done.  Breast exam normal.  Left breast PASH October 2021.  We will repeat a mammogram this year.  Followed at [facility] for myasthenia gravis. - Cytology - PAP( [place])  2. Encounter for other contraceptive management Declined.  3. Class 3 severe obesity due to excess calories without serious comorbidity with body mass index (BMI) of 40.0 to 44.9 in adult [facility]) Recommend a lower calorie/carb diet.  Aerobic activities 5 times a  week and light weightlifting every 2 days.  Other orders - EPINEPHrine 0.3 mg/0.3 mL IJ SOAJ injection; Inject into the muscle. - oxyCODONE-acetaminophen (PERCOCET/ROXICET) 5-325 MG tablet; Take 1-2 tablets by mouth every 6 (six) hours as needed. - UBRELVY 100 MG TABS; Take by mouth.   Genia Del MD, 8:36 AM 01/04/2021

## 2021-01-05 LAB — CYTOLOGY - PAP: Diagnosis: NEGATIVE

## 2021-01-17 DIAGNOSIS — Z0289 Encounter for other administrative examinations: Secondary | ICD-10-CM

## 2021-01-26 ENCOUNTER — Other Ambulatory Visit: Payer: Self-pay | Admitting: Obstetrics & Gynecology

## 2021-01-26 DIAGNOSIS — N63 Unspecified lump in unspecified breast: Secondary | ICD-10-CM

## 2021-01-30 ENCOUNTER — Ambulatory Visit
Admission: RE | Admit: 2021-01-30 | Discharge: 2021-01-30 | Disposition: A | Discharge status: Home / Self Care | Payer: BC Managed Care – PPO | Source: Ambulatory Visit | Attending: Obstetrics and Gynecology | Admitting: Obstetrics and Gynecology

## 2021-01-30 ENCOUNTER — Other Ambulatory Visit: Payer: Self-pay

## 2021-01-30 DIAGNOSIS — N63 Unspecified lump in unspecified breast: Secondary | ICD-10-CM

## 2021-02-08 ENCOUNTER — Other Ambulatory Visit: Payer: Self-pay

## 2021-02-08 ENCOUNTER — Encounter (INDEPENDENT_AMBULATORY_CARE_PROVIDER_SITE_OTHER): Payer: Self-pay | Admitting: Bariatrics

## 2021-02-08 ENCOUNTER — Ambulatory Visit (INDEPENDENT_AMBULATORY_CARE_PROVIDER_SITE_OTHER): Payer: BC Managed Care – PPO | Admitting: Bariatrics

## 2021-02-08 VITALS — BP 133/95 | HR 91 | Temp 98.4°F | Ht 67.0 in | Wt 259.0 lb

## 2021-02-08 DIAGNOSIS — J4520 Mild intermittent asthma, uncomplicated: Secondary | ICD-10-CM

## 2021-02-08 DIAGNOSIS — R0602 Shortness of breath: Secondary | ICD-10-CM

## 2021-02-08 DIAGNOSIS — R7303 Prediabetes: Secondary | ICD-10-CM

## 2021-02-08 DIAGNOSIS — I10 Essential (primary) hypertension: Secondary | ICD-10-CM

## 2021-02-08 DIAGNOSIS — M797 Fibromyalgia: Secondary | ICD-10-CM

## 2021-02-08 DIAGNOSIS — R5383 Other fatigue: Secondary | ICD-10-CM

## 2021-02-08 DIAGNOSIS — G4733 Obstructive sleep apnea (adult) (pediatric): Secondary | ICD-10-CM

## 2021-02-08 DIAGNOSIS — Z1331 Encounter for screening for depression: Secondary | ICD-10-CM

## 2021-02-08 DIAGNOSIS — Z6841 Body Mass Index (BMI) 40.0 and over, adult: Secondary | ICD-10-CM

## 2021-02-08 DIAGNOSIS — K219 Gastro-esophageal reflux disease without esophagitis: Secondary | ICD-10-CM | POA: Diagnosis not present

## 2021-02-08 DIAGNOSIS — E559 Vitamin D deficiency, unspecified: Secondary | ICD-10-CM

## 2021-02-08 NOTE — Progress Notes (Signed)
Chief Complaint:   OBESITY Sandy Baker (MR# [id number]) is a 35 y.o. female who presents for evaluation and treatment of obesity and related comorbidities. Current BMI is Body mass index is 40.57 kg/m. Sandy Baker has been struggling with her weight for many years and has been unsuccessful in either losing weight, maintaining weight loss, or reaching her healthy weight goal.  Sandy Baker is a returning patient to our office. She last saw Dr. Jearld Shines on 06/02/2019. She states that she has food allergies with oranges, tomatoes, and strawberries (minimal).   Sandy Baker is currently in the action stage of change and ready to dedicate time achieving and maintaining a healthier weight. Sandy Baker is interested in becoming our patient and working on intensive lifestyle modifications including (but not limited to) diet and exercise for weight loss.  Sandy Baker's habits were reviewed today and are as follows: her desired weight loss is 109-114 pounds, she started gaining weight in 2019, and her heaviest weight ever was 255 pounds.  Depression Screen Sandy Baker's Food and Mood (modified PHQ-9) score was 1.  Depression screen [facility] 2/9 02/08/2021  Decreased Interest 0  Down, Depressed, Hopeless 0  PHQ - 2 Score 0  Altered sleeping 1  Tired, decreased energy 0  Change in appetite 0  Feeling bad or failure about yourself  0  Trouble concentrating 0  Moving slowly or fidgety/restless 0  Suicidal thoughts 0  PHQ-9 Score 1  Difficult doing work/chores Not difficult at all   Subjective:   1. Other fatigue Sandy Baker admits to daytime somnolence and denies waking up still tired. Sandy Baker has a history of symptoms of morning headache. Sandy Baker generally gets  8-12  hours of sleep per night, and states that she has generally restful sleep. Snoring is not present. Apneic episodes is present. Epworth Sleepiness Score is 3.   2. SOB (shortness of breath) on exertion Sameena notes increasing shortness of breath with exercising  and seems to be worsening over time with weight gain. She notes getting out of breath sooner with activity than she used to. This has not gotten worse recently. Lateisha denies shortness of breath at rest or orthopnea.   3. Gastroesophageal reflux disease without esophagitis Sandy Baker is currently not on medications.  4. Fibromyalgia Sandy Baker is not on medications currently.   5. OSA (obstructive sleep apnea) Sandy Baker has a diagnosis of sleep apnea. She wears her CPAP.  6. Mild intermittent asthma without complication Sandy Baker's asthma is controlled. She rarely uses her inhaler.   7. Essential hypertension Sandy Baker has a history of hypertension. She is taking Candesartan currently.   8. Pre-diabetes Sandy Baker's last A1C level was 6.0. She states she is not progressing.  9. Vitamin D deficiency Sandy Baker is currently not on medications.  Assessment/Plan:   1. Other fatigue Sandy Baker does not feel that her weight is causing her energy to be lower than it should be. Fatigue may be related to obesity, depression or many other causes. Labs will be ordered, and in the meanwhile, Sandy Baker will focus on self care including making healthy food choices, increasing physical activity and focusing on stress reduction.  - EKG 12-Lead  2. SOB (shortness of breath) on exertion Adelyna does not feel that she gets out of breath more easily that she used to when she exercises. Sandy Baker's shortness of breath appears to be obesity related and exercise induced. She has agreed to work on weight loss and gradually increase exercise to treat her exercise induced shortness of breath. Will continue to monitor closely.  3. Gastroesophageal reflux disease without esophagitis Intensive lifestyle modifications are the first line treatment for this issue. We discussed several lifestyle modifications today. Trude will avoid trigger food, and will continue to work on diet, exercise and weight loss efforts. Orders and follow up  as documented in patient record.   Counseling If a person has gastroesophageal reflux disease (GERD), food and stomach acid move back up into the esophagus and cause symptoms or problems such as damage to the esophagus. Anti-reflux measures include: raising the head of the bed, avoiding tight clothing or belts, avoiding eating late at night, not lying down shortly after mealtime, and achieving weight loss. Avoid ASA, NSAID's, caffeine, alcohol, and tobacco.  OTC Pepcid and/or Tums are often very helpful for as needed use.  However, for persisting chronic or daily symptoms, stronger medications like Omeprazole may be needed. You may need to avoid foods and drinks such as: Coffee and tea (with or without caffeine). Drinks that contain alcohol. Energy drinks and sports drinks. Bubbly (carbonated) drinks or sodas. Chocolate and cocoa. Peppermint and mint flavorings. Garlic and onions. Horseradish. Spicy and acidic foods. These include peppers, chili powder, curry powder, vinegar, hot sauces, and BBQ sauce. Citrus fruit juices and citrus fruits, such as oranges, lemons, and limes. Tomato-based foods. These include red sauce, chili, salsa, and pizza with red sauce. Fried and fatty foods. These include donuts, french fries, potato chips, and high-fat dressings. High-fat meats. These include hot dogs, rib eye steak, sausage, ham, and bacon.   4. Fibromyalgia Intensive lifestyle modifications are the first line treatment for this issue. We discussed several lifestyle modifications today. Sandy Baker will continue to work on diet, exercise, and weight loss efforts.We will continue to monitor. Orders and follow up as documented in patient record.   Counseling Try [email or web address]-[email or web address]/, which is a series of self-care modules designed to teach patients several techniques to manage pain.    5. OSA (obstructive sleep apnea) Intensive lifestyle modifications are the first line treatment for  this issue. We discussed several lifestyle modifications today. Sandy Baker will continue her CPAP, and she will continue to work on diet, exercise and weight loss efforts. We will continue to monitor. Orders and follow up as documented in patient record.    6. Mild intermittent asthma without complication Sandy Baker will continue her medications as directed.   7. Essential hypertension Sandy Baker will continue medications, and we will check labs today. She will continue working on healthy weight loss and exercise to improve blood pressure control. We will watch for signs of hypotension as she continues her lifestyle modifications.  - Comprehensive metabolic panel  8. Pre-diabetes Sandy Baker will continue to work on weight loss, exercise, and decreasing simple carbohydrates, and increasing healthy fats and protein to help decrease the risk of diabetes. We will check labs today.  - Comprehensive metabolic panel - Hemoglobin A1c - Insulin, random  9. Vitamin D deficiency Low Vitamin D level contributes to fatigue and are associated with obesity, breast, and colon cancer. We will check labs today and Sandy Baker will follow-up for routine testing of Vitamin D, at least 2-3 times per year to avoid over-replacement.  - VITAMIN D 25 Hydroxy (Vit-D Deficiency, Fractures)  10. Depression screening Sandy Baker had a negative depression screening. Depression is commonly associated with obesity and often results in emotional eating behaviors. We will monitor this closely and work on CBT to help improve the non-hunger eating patterns. Referral to Psychology may be required if no improvement is seen as she continues in our  clinic.   11. Class 3 severe obesity with serious comorbidity and body mass index (BMI) of 40.0 to 44.9 in adult, unspecified obesity type (HCC) Sandy Baker is currently in the action stage of change and her goal is to continue with weight loss efforts. I recommend Sandy Baker begin the structured treatment plan  as follows:  She has agreed to the Category 2 Plan.  Izabelle will continue meal planning and mindful eating. We reviewed labs from 10/2020.  Exercise goals:  Batsheva will continue working out high intensity weights and cardio.    Behavioral modification strategies: increasing lean protein intake, decreasing simple carbohydrates, increasing vegetables, increasing water intake, decreasing eating out, no skipping meals, meal planning and cooking strategies, keeping healthy foods in the home, and planning for success.  She was informed of the importance of frequent follow-up visits to maximize her success with intensive lifestyle modifications for her multiple health conditions. She was informed we would discuss her lab results at her next visit unless there is a critical issue that needs to be addressed sooner. Anandi agreed to keep her next visit at the agreed upon time to discuss these results.  Objective:   Blood pressure (!) 133/95, pulse 91, temperature 98.4 F (36.9 C), height 5\' 7"  (1.702 m), weight 259 lb (117.5 kg), last menstrual period 01/14/2021, SpO2 98 %. Body mass index is 40.57 kg/m.  EKG: Normal sinus rhythm, rate 85 bpm.  Indirect Calorimeter completed today shows a VO2 of 260 and a REE of 1800.  Her calculated basal metabolic rate is 03/16/2021 thus her basal metabolic rate is worse than expected.  General: Cooperative, alert, well developed, in no acute distress. HEENT: Conjunctivae and lids unremarkable. Cardiovascular: Regular rhythm.  Lungs: Normal work of breathing. Neurologic: No focal deficits.   Lab Results  Component Value Date   CREATININE 0.52 12/28/2015   BUN 7 12/28/2015   NA 139 12/28/2015   K 4.0 12/28/2015   CL 99 12/28/2015   CO2 33 (H) 12/28/2015   Lab Results  Component Value Date   ALT 14 12/28/2015   AST 17 12/28/2015   ALKPHOS 38 12/28/2015   BILITOT 0.4 12/28/2015   No results found for: HGBA1C No results found for: INSULIN Lab Results   Component Value Date   TSH 0.85 12/28/2016   No results found for: CHOL, HDL, LDLCALC, LDLDIRECT, TRIG, CHOLHDL Lab Results  Component Value Date   WBC 11.0 (H) 12/28/2016   HGB 10.5 (L) 12/28/2016   HCT 36.7 12/28/2016   MCV 81.7 12/28/2016   PLT 287 12/28/2016   No results found for: IRON, TIBC, FERRITIN  Attestation Statements:   Reviewed by clinician on day of visit: allergies, medications, problem list, medical history, surgical history, family history, social history, and previous encounter notes.  I, 12/30/2016, RMA, am acting as [employer] for Energy manager, DO.   I have reviewed the above documentation for accuracy and completeness, and I agree with the above. [employer], DO

## 2021-02-09 ENCOUNTER — Encounter (INDEPENDENT_AMBULATORY_CARE_PROVIDER_SITE_OTHER): Payer: Self-pay | Admitting: Bariatrics

## 2021-02-09 DIAGNOSIS — E559 Vitamin D deficiency, unspecified: Secondary | ICD-10-CM | POA: Insufficient documentation

## 2021-02-09 DIAGNOSIS — R7303 Prediabetes: Secondary | ICD-10-CM | POA: Insufficient documentation

## 2021-02-09 LAB — COMPREHENSIVE METABOLIC PANEL
ALT: 22 IU/L (ref 0–32)
AST: 22 IU/L (ref 0–40)
Albumin/Globulin Ratio: 1.7 (ref 1.2–2.2)
Albumin: 4.8 g/dL (ref 3.8–4.8)
Alkaline Phosphatase: 132 IU/L — ABNORMAL HIGH (ref 44–121)
BUN/Creatinine Ratio: 8 — ABNORMAL LOW (ref 9–23)
BUN: 7 mg/dL (ref 6–20)
Bilirubin Total: <0.2 mg/dL (ref 0.0–1.2)
CO2: 20 mmol/L (ref 20–29)
Calcium: 9.3 mg/dL (ref 8.7–10.2)
Chloride: 101 mmol/L (ref 96–106)
Creatinine, Ser: 0.91 mg/dL (ref 0.57–1.00)
Globulin, Total: 2.8 g/dL (ref 1.5–4.5)
Glucose: 89 mg/dL (ref 70–99)
Potassium: 4.2 mmol/L (ref 3.5–5.2)
Sodium: 135 mmol/L (ref 134–144)
Total Protein: 7.6 g/dL (ref 6.0–8.5)
eGFR: 85 mL/min/{1.73_m2} (ref >59)

## 2021-02-09 LAB — INSULIN, RANDOM: INSULIN: 173 u[IU]/mL — ABNORMAL HIGH (ref 2.6–24.9)

## 2021-02-09 LAB — HEMOGLOBIN A1C
Est. average glucose Bld gHb Est-mCnc: 131 mg/dL
Hgb A1c MFr Bld: 6.2 % — ABNORMAL HIGH (ref 4.8–5.6)

## 2021-02-09 LAB — VITAMIN D 25 HYDROXY (VIT D DEFICIENCY, FRACTURES): Vit D, 25-Hydroxy: 9.4 ng/mL — ABNORMAL LOW (ref 30.0–100.0)

## 2021-02-13 ENCOUNTER — Encounter (INDEPENDENT_AMBULATORY_CARE_PROVIDER_SITE_OTHER): Payer: Self-pay | Admitting: Bariatrics

## 2021-02-22 ENCOUNTER — Ambulatory Visit (INDEPENDENT_AMBULATORY_CARE_PROVIDER_SITE_OTHER): Payer: BC Managed Care – PPO | Admitting: Bariatrics

## 2021-02-22 ENCOUNTER — Encounter (INDEPENDENT_AMBULATORY_CARE_PROVIDER_SITE_OTHER): Payer: Self-pay | Admitting: Bariatrics

## 2021-02-22 ENCOUNTER — Other Ambulatory Visit: Payer: Self-pay

## 2021-02-22 VITALS — BP 111/76 | HR 84 | Temp 98.1°F | Ht 67.0 in | Wt 257.0 lb

## 2021-02-22 DIAGNOSIS — R7303 Prediabetes: Secondary | ICD-10-CM

## 2021-02-22 DIAGNOSIS — E559 Vitamin D deficiency, unspecified: Secondary | ICD-10-CM | POA: Diagnosis not present

## 2021-02-22 DIAGNOSIS — Z6841 Body Mass Index (BMI) 40.0 and over, adult: Secondary | ICD-10-CM | POA: Diagnosis not present

## 2021-02-22 MED ORDER — VITAMIN D (ERGOCALCIFEROL) 1.25 MG (50000 UNIT) PO CAPS
50000.0000 [IU] | ORAL_CAPSULE | ORAL | 0 refills | Status: DC
Start: 2021-02-22 — End: 2021-03-08

## 2021-02-23 ENCOUNTER — Encounter (INDEPENDENT_AMBULATORY_CARE_PROVIDER_SITE_OTHER): Payer: Self-pay | Admitting: Bariatrics

## 2021-02-23 NOTE — Progress Notes (Signed)
Chief Complaint:   OBESITY Sandy Baker is here to discuss her progress with her obesity treatment plan along with follow-up of her obesity related diagnoses. Sandy Baker is on the Category 2 Plan and states she is following her eating plan approximately 80% of the time. Sandy Baker states she is doing F45 for 45 minutes 5 times per week.  Today's visit was #: 3 Starting weight: 259 lbs Starting date: 02/08/2021 Today's weight: 257 lbs Today's date: 02/22/2021 Total lbs lost to date: 2 lbs Total lbs lost since last in-office visit: 2 lbs  Interim History: Sandy Baker is down 2 lbs since her first visit. She denies any issues, except on the weekends (more active).  Subjective:   1. Vitamin D deficiency Sandy Baker's last Vitamin D level was 9.4. Sandy Baker agrees to start prescription Vitamin D 50,000 IU every 7 days.  2. Pre-diabetes Sandy Baker is currently not on medication. Her last A1C level was 6.2. Her Insulin level was 173.0.  Assessment/Plan:   1. Vitamin D deficiency Low Vitamin D level contributes to fatigue and are associated with obesity, breast, and colon cancer. Adelyne agrees to start to take prescription Vitamin D 50,000 IU every week for 1 month with no refills and she will follow-up for routine testing of Vitamin D, at least 2-3 times per year to avoid over-replacement.  - Vitamin D, Ergocalciferol, (DRISDOL) 1.25 MG (50000 UNIT) CAPS capsule; Take 1 capsule (50,000 Units total) by mouth every 7 (seven) days.  Dispense: 4 capsule; Refill: 0  2. Pre-diabetes Sandy Baker will continue to work on weight loss, exercise, and decreasing simple carbohydrates to help decrease the risk of diabetes. She will increase healthy protein and fats. She will increase activities.  3. Obesity, current BMI 40.3 Sandy Baker is currently in the action stage of change. As such, her goal is to continue with weight loss efforts. She has agreed to the Category 2 Plan.   Sandy Baker will continue meal planning. We  discussed labs from 02/08/2021 CMP, Vitamin D, A1C and Insulin. A fruit list and [place] eating was provided today. Protein equivalent sheet given.   Exercise goals:  As is.  Behavioral modification strategies: increasing lean protein intake, decreasing simple carbohydrates, increasing vegetables, increasing water intake, decreasing eating out, no skipping meals, meal planning and cooking strategies, keeping healthy foods in the home, holiday eating strategies , and planning for success.  Sandy Baker has agreed to follow-up with our clinic in 2 weeks. She was informed of the importance of frequent follow-up visits to maximize her success with intensive lifestyle modifications for her multiple health conditions.   Objective:   Blood pressure 111/76, pulse 84, temperature 98.1 F (36.7 C), height 5\' 7"  (1.702 m), weight 257 lb (116.6 kg), SpO2 100 %. Body mass index is 40.25 kg/m.  General: Cooperative, alert, well developed, in no acute distress. HEENT: Conjunctivae and lids unremarkable. Cardiovascular: Regular rhythm.  Lungs: Normal work of breathing. Neurologic: No focal deficits.   Lab Results  Component Value Date   CREATININE 0.91 02/08/2021   BUN 7 02/08/2021   NA 135 02/08/2021   K 4.2 02/08/2021   CL 101 02/08/2021   CO2 20 02/08/2021   Lab Results  Component Value Date   ALT 22 02/08/2021   AST 22 02/08/2021   ALKPHOS 132 (H) 02/08/2021   BILITOT <0.2 02/08/2021   Lab Results  Component Value Date   HGBA1C 6.2 (H) 02/08/2021   Lab Results  Component Value Date   INSULIN 173.0 (H) 02/08/2021  Lab Results  Component Value Date   TSH 0.85 12/28/2016   No results found for: CHOL, HDL, LDLCALC, LDLDIRECT, TRIG, CHOLHDL Lab Results  Component Value Date   VD25OH 9.4 (L) 02/08/2021   Lab Results  Component Value Date   WBC 11.0 (H) 12/28/2016   HGB 10.5 (L) 12/28/2016   HCT 36.7 12/28/2016   MCV 81.7 12/28/2016   PLT 287 12/28/2016   No results found  for: IRON, TIBC, FERRITIN  Attestation Statements:   Reviewed by clinician on day of visit: allergies, medications, problem list, medical history, surgical history, family history, social history, and previous encounter notes.  I, Sandy Baker, RMA, am acting as Location manager for [employer], DO.   I have reviewed the above documentation for accuracy and completeness, and I agree with the above. Sandy Lesch, DO

## 2021-03-08 ENCOUNTER — Other Ambulatory Visit: Payer: Self-pay

## 2021-03-08 ENCOUNTER — Encounter (INDEPENDENT_AMBULATORY_CARE_PROVIDER_SITE_OTHER): Payer: Self-pay | Admitting: Family Medicine

## 2021-03-08 ENCOUNTER — Ambulatory Visit (INDEPENDENT_AMBULATORY_CARE_PROVIDER_SITE_OTHER): Payer: BC Managed Care – PPO | Admitting: Family Medicine

## 2021-03-08 VITALS — BP 121/81 | HR 86 | Temp 97.8°F | Ht 67.0 in | Wt 259.0 lb

## 2021-03-08 DIAGNOSIS — J9612 Chronic respiratory failure with hypercapnia: Secondary | ICD-10-CM

## 2021-03-08 DIAGNOSIS — I5189 Other ill-defined heart diseases: Secondary | ICD-10-CM

## 2021-03-08 DIAGNOSIS — M797 Fibromyalgia: Secondary | ICD-10-CM

## 2021-03-08 DIAGNOSIS — K219 Gastro-esophageal reflux disease without esophagitis: Secondary | ICD-10-CM

## 2021-03-08 DIAGNOSIS — Z79899 Other long term (current) drug therapy: Secondary | ICD-10-CM

## 2021-03-08 DIAGNOSIS — G935 Compression of brain: Secondary | ICD-10-CM

## 2021-03-08 DIAGNOSIS — Z6833 Body mass index (BMI) 33.0-33.9, adult: Secondary | ICD-10-CM

## 2021-03-08 DIAGNOSIS — R471 Dysarthria and anarthria: Secondary | ICD-10-CM

## 2021-03-08 DIAGNOSIS — R7303 Prediabetes: Secondary | ICD-10-CM | POA: Diagnosis not present

## 2021-03-08 DIAGNOSIS — G7000 Myasthenia gravis without (acute) exacerbation: Secondary | ICD-10-CM

## 2021-03-08 DIAGNOSIS — E559 Vitamin D deficiency, unspecified: Secondary | ICD-10-CM

## 2021-03-08 DIAGNOSIS — I10 Essential (primary) hypertension: Secondary | ICD-10-CM

## 2021-03-08 DIAGNOSIS — S63642S Sprain of metacarpophalangeal joint of left thumb, sequela: Secondary | ICD-10-CM

## 2021-03-08 DIAGNOSIS — G4733 Obstructive sleep apnea (adult) (pediatric): Secondary | ICD-10-CM | POA: Diagnosis not present

## 2021-03-08 DIAGNOSIS — E669 Obesity, unspecified: Secondary | ICD-10-CM

## 2021-03-08 MED ORDER — VITAMIN D (ERGOCALCIFEROL) 1.25 MG (50000 UNIT) PO CAPS: 50000.0000 [IU] | ORAL_CAPSULE | ORAL | 0 refills | Status: DC

## 2021-03-09 ENCOUNTER — Encounter (INDEPENDENT_AMBULATORY_CARE_PROVIDER_SITE_OTHER): Payer: Self-pay | Admitting: Family Medicine

## 2021-03-09 DIAGNOSIS — R7303 Prediabetes: Secondary | ICD-10-CM

## 2021-03-13 MED ORDER — METFORMIN HCL ER 500 MG PO TB24: 500.0000 mg | ORAL_TABLET | Freq: Every day | ORAL | 0 refills | Status: DC

## 2021-03-13 NOTE — Progress Notes (Signed)
Chief Complaint:   OBESITY Sandy Baker is here to discuss her progress with her obesity treatment plan along with follow-up of her obesity related diagnoses. See Medical Weight Management Flowsheet for complete bioelectrical impedance results.  Today's visit was #: 4 Starting weight: 259 lbs Starting date: 02/08/2021 Weight change since last visit: 0 Total lbs lost to date: 0  Nutrition Plan: Category 2 Plan for 80% of the time. Activity: F45 for 45 minutes 5 times per week.  Assessment/Plan:   1. Vitamin D deficiency Not at goal.  She is taking vitamin D 50,000 IU weekly.  Plan: Continue to take prescription Vitamin D @50 ,000 IU every week as prescribed.  Follow-up for routine testing of Vitamin D, at least 2-3 times per year to avoid over-replacement.  Lab Results  Component Value Date   VD25OH 9.4 (L) 02/08/2021   - Refill Vitamin D, Ergocalciferol, (DRISDOL) 1.25 MG (50000 UNIT) CAPS capsule; Take 1 capsule (50,000 Units total) by mouth every 7 (seven) days.  Dispense: 4 capsule; Refill: 0  2. Pre-diabetes Not at goal. Goal is HgbA1c < 5.7.  Medication: None.    Plan: She will continue to focus on protein-rich, low simple carbohydrate foods. We reviewed the importance of hydration, regular exercise for stress reduction, and restorative sleep.   Lab Results  Component Value Date   HGBA1C 6.2 (H) 02/08/2021   Lab Results  Component Value Date   INSULIN 173.0 (H) 02/08/2021   3. Severe OSA (obstructive sleep apnea), Trilogy therapy via [facility], followed by [facility] Goal: Treatment of OSA via CPAP compliance and weight loss. Plasma ghrelin levels (appetite or "hunger hormone") are significantly higher in OSA patients than in BMI-matched controls, but decrease to levels similar to those of obese patients without OSA after CPAP treatment.  Weight loss improves OSA by several mechanisms, including reduction in fatty tissue in the throat (i.e. parapharyngeal fat)  and the tongue. Loss of abdominal fat increases mediastinal traction on the upper airway making it less likely to collapse during sleep. Studies have also shown that compliance with CPAP treatment improves leptin (hunger inhibitory hormone) imbalance.  4. Gastroesophageal reflux disease without esophagitis Sandy Baker is taking Protonix 40 mg daily for GERD. The current medical regimen is effective;  continue present plan and medications.  5. Fibromyalgia Intensive lifestyle modifications are the first line treatment for this issue. We discussed several lifestyle modifications today and she will continue to work on diet, exercise and weight loss efforts.We will continue to monitor.   6. Essential hypertension At goal. Medications: verapamil 240 mg daily and Atacand 16 mg daily.   Plan: Avoid buying foods that are: processed, frozen, or prepackaged to avoid excess salt. We will watch for signs of hypotension as she continues lifestyle modifications.  BP Readings from Last 3 Encounters:  03/08/21 121/81  02/22/21 111/76  02/08/21 (!) 133/95   Lab Results  Component Value Date   CREATININE 0.91 02/08/2021   7. Myasthenia gravis (HCC), MuSK positive, generalized Sandy Baker is taking Cellcept 750 mg twice daily.  She is followed by Neurology. We will continue to monitor symptoms as they relate to her weight loss journey.  8. Gamekeeper's thumb of left hand, sequela Sandy Baker is followed by Occupational Therapy and Orthopedics.  9. Dysarthria Sandy Baker is followed by Neurology.  Will continue to follow along as it relates to her weight loss journey.  10. Chiari I malformation (HCC) She is followed by Neurosurgery.    11. Right ventricular systolic dysfunction  without heart failure We will continue to monitor symptoms as they relate to her weight loss journey.  12. Chronic respiratory failure with hypercapnia and hypoxemia (HCC) She is followed by Neurology.  13. Hx of long-term treatment with  high-risk medication, prednisone  14. Obesity BMI today is 40  Course: Sandy Baker is currently in the action stage of change. As such, her goal is to continue with weight loss efforts.   Nutrition goals: She has agreed to the Category 2 Plan.   Exercise goals:  As is.  Behavioral modification strategies: increasing lean protein intake, decreasing simple carbohydrates, increasing vegetables, and increasing water intake.  Truly has agreed to follow-up with our clinic in 4 weeks. She was informed of the importance of frequent follow-up visits to maximize her success with intensive lifestyle modifications for her multiple health conditions.   Objective:   Blood pressure 121/81, pulse 86, temperature 97.8 F (36.6 C), height 5\' 7"  (1.702 m), weight 259 lb (117.5 kg), SpO2 98 %. Body mass index is 40.57 kg/m.  General: Cooperative, alert, well developed, in no acute distress. HEENT: Conjunctivae and lids unremarkable. Cardiovascular: Regular rhythm.  Lungs: Normal work of breathing. Neurologic: No focal deficits.   Lab Results  Component Value Date   CREATININE 0.91 02/08/2021   BUN 7 02/08/2021   NA 135 02/08/2021   K 4.2 02/08/2021   CL 101 02/08/2021   CO2 20 02/08/2021   Lab Results  Component Value Date   ALT 22 02/08/2021   AST 22 02/08/2021   ALKPHOS 132 (H) 02/08/2021   BILITOT <0.2 02/08/2021   Lab Results  Component Value Date   HGBA1C 6.2 (H) 02/08/2021   Lab Results  Component Value Date   INSULIN 173.0 (H) 02/08/2021   Lab Results  Component Value Date   TSH 0.85 12/28/2016   Lab Results  Component Value Date   VD25OH 9.4 (L) 02/08/2021   Lab Results  Component Value Date   WBC 11.0 (H) 12/28/2016   HGB 10.5 (L) 12/28/2016   HCT 36.7 12/28/2016   MCV 81.7 12/28/2016   PLT 287 12/28/2016   Attestation Statements:   Reviewed by clinician on day of visit: allergies, medications, problem list, medical history, surgical history, family history,  social history, and previous encounter notes.  Time spent on visit including pre-visit chart review and post-visit care and documentation was 46 minutes.This was my first visit with Sandy Baker. Time was spent on: During the visit, I independently reviewed the patient's EKG, bioimpedance scale results, and indirect calorimeter results. Food choices and timing of food intake reviewed today. I discussed a personalized meal plan with the patient that will help her to lose weight and will improve her obesity-related conditions going forward. I performed a medically necessary appropriate examination and/or evaluation. I discussed the assessment and treatment plan with the patient. Motivational interviewing as well as evidence-based interventions for health behavior change were utilized today including the discussion of self monitoring techniques, problem-solving barriers and SMART goal setting techniques.  An exercise prescription was reviewed.  The patient was provided an opportunity to ask questions and all were answered. The patient agreed with the plan and demonstrated an understanding of the instructions. Clinical information was updated and documented in the EMR.  I, 12/30/2016, CMA, am acting as transcriptionist for Insurance claims handler, DO  I have reviewed the above documentation for accuracy and completeness, and I agree with the above. -  Helane Rima, DO, MS, FAAFP, DABOM - Family and Bariatric Medicine.

## 2021-03-30 ENCOUNTER — Ambulatory Visit (INDEPENDENT_AMBULATORY_CARE_PROVIDER_SITE_OTHER): Payer: BC Managed Care – PPO | Admitting: Family Medicine

## 2021-03-30 ENCOUNTER — Encounter (INDEPENDENT_AMBULATORY_CARE_PROVIDER_SITE_OTHER): Payer: Self-pay

## 2021-04-19 ENCOUNTER — Encounter (INDEPENDENT_AMBULATORY_CARE_PROVIDER_SITE_OTHER): Payer: Self-pay | Admitting: Family Medicine

## 2021-04-19 DIAGNOSIS — R7303 Prediabetes: Secondary | ICD-10-CM

## 2021-04-19 DIAGNOSIS — E559 Vitamin D deficiency, unspecified: Secondary | ICD-10-CM

## 2021-04-19 MED ORDER — VITAMIN D (ERGOCALCIFEROL) 1.25 MG (50000 UNIT) PO CAPS: 50000.0000 [IU] | ORAL_CAPSULE | ORAL | 0 refills | Status: DC

## 2021-04-19 MED ORDER — METFORMIN HCL ER 500 MG PO TB24: 500.0000 mg | ORAL_TABLET | Freq: Every day | ORAL | 0 refills | Status: DC

## 2021-05-10 ENCOUNTER — Other Ambulatory Visit: Payer: Self-pay

## 2021-05-10 ENCOUNTER — Ambulatory Visit (HOSPITAL_BASED_OUTPATIENT_CLINIC_OR_DEPARTMENT_OTHER)
Admission: RE | Admit: 2021-05-10 | Discharge: 2021-05-10 | Disposition: A | Discharge status: Home / Self Care | Payer: BC Managed Care – PPO | Source: Ambulatory Visit | Attending: Emergency Medicine | Admitting: Emergency Medicine

## 2021-05-10 ENCOUNTER — Ambulatory Visit
Admission: RE | Admit: 2021-05-10 | Discharge: 2021-05-10 | Disposition: A | Discharge status: Home / Self Care | Payer: BC Managed Care – PPO | Source: Ambulatory Visit | Attending: Emergency Medicine | Admitting: Emergency Medicine

## 2021-05-10 VITALS — BP 139/89 | HR 94 | Temp 98.6°F | Resp 20

## 2021-05-10 DIAGNOSIS — Z79899 Other long term (current) drug therapy: Secondary | ICD-10-CM

## 2021-05-10 DIAGNOSIS — J4521 Mild intermittent asthma with (acute) exacerbation: Secondary | ICD-10-CM | POA: Diagnosis not present

## 2021-05-10 DIAGNOSIS — U071 COVID-19: Secondary | ICD-10-CM | POA: Diagnosis not present

## 2021-05-10 DIAGNOSIS — J988 Other specified respiratory disorders: Secondary | ICD-10-CM | POA: Insufficient documentation

## 2021-05-10 DIAGNOSIS — D84821 Immunodeficiency due to drugs: Secondary | ICD-10-CM | POA: Insufficient documentation

## 2021-05-10 DIAGNOSIS — Z9189 Other specified personal risk factors, not elsewhere classified: Secondary | ICD-10-CM | POA: Diagnosis not present

## 2021-05-10 NOTE — ED Triage Notes (Addendum)
Pt reports having an infusion (rituximab) on Monday and is now having a cough and headache that is getting worse.

## 2021-05-10 NOTE — Discharge Instructions (Addendum)
At this time, I do not see any safe inhaled steroids or antibiotics for you to take that will address your current upper respiratory symptoms.  I have ordered you an x-ray to be performed at the [facility] location.  We will contact you with the results of the imaging once we receive the report from the radiologist was typically takes an hour or 2.    The results of your COVID and influenza tests will be posted to your MyChart account once complete, this typically takes 24 to 48 hours.  There is a positive result, you will be contacted by phone.  I recommend that you follow-up either with your primary care provider or your rheumatologist to see what medications they would recommend given your history of myasthenia gravis and multiple drug allergies.  Thank you for visiting urgent care today.

## 2021-05-10 NOTE — ED Provider Notes (Addendum)
[facility]    CSN: [id number] Arrival date & time: 05/10/21  1319    HISTORY   Chief Complaint  Patient presents with   Cough   HPI Sandy Baker is a 36 y.o. female. Pt reports having an infusion (rituximab) on Monday and is now having a cough and headache that is getting worse.  Patient states she is never had a reaction to rituximab in the past.  Patient states she began to feel unwell a few hours after the infusion was complete.  Patient reports of history of asthma, states she has been using her albuterol nebulizer with some relief.  Patient reports a history of myasthenia gravis which is accompanied by multiple drug contraindications as well as multiple severe drug allergies otherwise reported by patient.  The history is provided by the patient.  Past Medical History:  Diagnosis Date   Acute respiratory failure (HCC)    With hypoxia and hypercapnea   Allergic rhinitis    Asthma    Autoimmune disease (HCC)    B12 deficiency    Chiari I malformation (HCC)    Congestive heart failure (HCC)    Depression    Fibromyalgia    Heartburn    Migraines    Multiple food allergies    Myasthenia gravis (HCC)    Prediabetes    Pulmonary hypertension (HCC)    Right ventricular dysfunction    Sleep apnea    TMJ (dislocation of temporomandibular joint)    Patient Active Problem List   Diagnosis Date Noted   Class 3 severe obesity due to excess calories with body mass index (BMI) of 40.0 to 44.9 in adult (HCC) 02/23/2021   Prediabetes 02/09/2021   Vitamin D deficiency 02/09/2021   Tachycardia 01/20/2016   Chiari I malformation (HCC) 12/30/2015   Dysarthria 12/26/2015   Migraine without aura and without status migrainosus, not intractable 12/21/2015   Obstructive sleep apnea syndrome 11/05/2014   GERD (gastroesophageal reflux disease) 09/12/2013   Asthma without status asthmaticus 09/09/2013   Fibromyalgia 09/09/2013   Allergic rhinitis 09/09/2013   Myasthenia  gravis (HCC) 06/19/2011   Past Surgical History:  Procedure Laterality Date   CARDIAC CATHETERIZATION     05-2017   THYMECTOMY     OB History     Gravida  0   Para      Term      Preterm      AB      Living         SAB      IAB      Ectopic      Multiple      Live Births             Home Medications    Prior to Admission medications   Medication Sig Start Date End Date Taking? Authorizing Provider  albuterol (ACCUNEB) 1.25 MG/3ML nebulizer solution Take 3 mLs (1.25 mg total) by nebulization every 6 (six) hours as needed for wheezing. 02/12/20   Janeece AgeeMorrow, Richard, NP  albuterol (VENTOLIN HFA) 108 (90 Base) MCG/ACT inhaler INHALE 2 PUFFS INTO THE LUNGS EVERY 6 HOURS AS NEEDED FOR WHEEZING OR SHORTNESS OF BREATH 12/21/19   Lezlie LyeSantiago Lago, Meda CoffeeIrma M, MD  butalbital-aspirin-caffeine [facility](FIORINAL) [phone] MG per tablet Take 1 tablet by mouth 2 (two) times daily as needed for headache.    [provider]  candesartan (ATACAND) 8 MG tablet Take 16 mg by mouth daily.    [provider]  EPINEPHrine 0.3 mg/0.3  mL IJ SOAJ injection Inject into the muscle. 11/18/20   [provider]  gabapentin (NEURONTIN) 300 MG capsule Take 300 mg by mouth 3 (three) times daily.    [provider]  ibuprofen (ADVIL,MOTRIN) 800 MG tablet  04/03/17   [provider]  levocetirizine (XYZAL) 5 MG tablet TAKE 1 TABLET(5 MG) BY MOUTH EVERY EVENING 02/08/20   Janeece Agee, NP  lidocaine (LIDODERM) 5 % Place 1 patch onto the skin daily. Remove & Discard patch within 12 hours or as directed by MD 12/29/19   Janeece Agee, NP  meloxicam (MOBIC) 15 MG tablet Take 1 tablet (15 mg total) by mouth daily as needed for pain. Take with food. Do not take with other NSAIDs. 08/14/19   Doristine Bosworth, MD  metFORMIN (GLUCOPHAGE-XR) 500 MG 24 hr tablet Take 1 tablet (500 mg total) by mouth daily with breakfast. 04/19/21   Helane Rima, DO  montelukast (SINGULAIR) 10 MG  tablet TAKE 1 TABLET(10 MG) BY MOUTH AT BEDTIME 08/14/19   Doristine Bosworth, MD  mycophenolate (CELLCEPT) 250 MG capsule Twice daily 08/19/14   [provider]  mycophenolate (CELLCEPT) 500 MG tablet Take by mouth. 2 twice daily    [provider]  oxyCODONE-acetaminophen (PERCOCET/ROXICET) 5-325 MG tablet Take 1-2 tablets by mouth every 6 (six) hours as needed. 12/08/20   [provider]  pantoprazole (PROTONIX) 40 MG tablet Take 1 tablet (40 mg total) by mouth daily. 08/14/17   Ofilia Neas, [name]  riTUXimab-abbs (TRUXIMA IV) Inject into the vein.    [provider]  UBRELVY 100 MG TABS Take by mouth. 12/25/20   [provider]  verapamil (VERELAN PM) 240 MG 24 hr capsule Take 240 mg by mouth at bedtime.    [provider]  Vitamin D, Ergocalciferol, (DRISDOL) 1.25 MG (50000 UNIT) CAPS capsule Take 1 capsule (50,000 Units total) by mouth every 7 (seven) days. 04/19/21   Helane Rima, DO  zonisamide (ZONEGRAN) 100 MG capsule Take 1 capsule (100 mg total) by mouth daily. Patient taking differently: Take 200 mg by mouth daily. 10/09/16   Garnetta Buddy, PA   Family History Family History  Problem Relation Age of Onset   Hypertension Mother    Depression Mother    Asthma Mother    Bipolar disorder Mother    Thyroid disease Mother    Obesity Mother    Anxiety disorder Mother    Sleep apnea Mother    Obesity Father    High blood pressure Father    Asthma Sister    Breast cancer Cousin    Social History Social History   Tobacco Use   Smoking status: Never   Smokeless tobacco: Never  Vaping Use   Vaping Use: Never used  Substance Use Topics   Alcohol use: No    Alcohol/week: 0.0 standard drinks   Drug use: No   Allergies   Aminoglycosides, Amoxicillin, Beta adrenergic blockers, Bioflavonoids, Macrolides and ketolides, Magnesium, Mometasone furo-formoterol fum, Onabotulinumtoxina, Penicillamine, Penicillins, Povidone-iodine,  Procainamide, Quinolones, Relpax [eletriptan], Sumatriptan, Tubocurarine, Botox [botulinum toxin type a], Doxycycline, Iodine, Latex, Quinine derivatives, Saline, Sodium chloride, Tetracyclines & related, and Zithromax [azithromycin]  Review of Systems Review of Systems Pertinent findings noted in history of present illness.   Physical Exam Triage Vital Signs ED Triage Vitals  Enc Vitals Group     BP 02/03/21 0827 (!) 147/82     Pulse Rate 02/03/21 0827 72     Resp 02/03/21 0827 18  Temp 02/03/21 0827 98.3 F (36.8 C)     Temp Source 02/03/21 0827 Oral     SpO2 02/03/21 0827 98 %     Weight --      Height --      Head Circumference --      Peak Flow --      Pain Score 02/03/21 0826 5     Pain Loc --      Pain Edu? --      Excl. in GC? --   No data found.  Updated Vital Signs BP 139/89 (BP Location: Left Arm)    Pulse 94    Temp 98.6 F (37 C) (Oral)    Resp 20    LMP 04/26/2021    SpO2 98%   Physical Exam Vitals and nursing note reviewed.  Constitutional:      General: She is not in acute distress.    Appearance: Normal appearance. She is not ill-appearing.  HENT:     Head: Normocephalic and atraumatic.     Salivary Glands: Right salivary gland is not diffusely enlarged or tender. Left salivary gland is not diffusely enlarged or tender.     Right Ear: Tympanic membrane, ear canal and external ear normal. No drainage. No middle ear effusion. There is no impacted cerumen. Tympanic membrane is not erythematous or bulging.     Left Ear: Tympanic membrane, ear canal and external ear normal. No drainage.  No middle ear effusion. There is no impacted cerumen. Tympanic membrane is not erythematous or bulging.     Nose: Mucosal edema, congestion and rhinorrhea present. No nasal deformity or septal deviation.     Right Turbinates: Not enlarged, swollen or pale.     Left Turbinates: Not enlarged, swollen or pale.     Right Sinus: No maxillary sinus tenderness or frontal sinus  tenderness.     Left Sinus: No maxillary sinus tenderness or frontal sinus tenderness.     Mouth/Throat:     Lips: Pink. No lesions.     Mouth: Mucous membranes are moist. No oral lesions.     Pharynx: Oropharynx is clear. Uvula midline. No pharyngeal swelling, oropharyngeal exudate, posterior oropharyngeal erythema or uvula swelling.     Tonsils: No tonsillar exudate. 0 on the right. 0 on the left.  Eyes:     General: Lids are normal.        Right eye: No discharge.        Left eye: No discharge.     Extraocular Movements: Extraocular movements intact.     Conjunctiva/sclera: Conjunctivae normal.     Right eye: Right conjunctiva is not injected.     Left eye: Left conjunctiva is not injected.  Neck:     Trachea: Trachea and phonation normal.  Cardiovascular:     Rate and Rhythm: Normal rate and regular rhythm.     Pulses: Normal pulses.     Heart sounds: Normal heart sounds. No murmur heard.   No friction rub. No gallop.  Pulmonary:     Effort: Pulmonary effort is normal. No accessory muscle usage, prolonged expiration or respiratory distress.     Breath sounds: Normal breath sounds. No stridor, decreased air movement or transmitted upper airway sounds. No decreased breath sounds, wheezing, rhonchi or rales.     Comments: Turbulent breath sounds throughout. Chest:     Chest wall: No tenderness.  Musculoskeletal:        General: Normal range of motion.     Cervical back:  Normal range of motion and neck supple. Normal range of motion.  Lymphadenopathy:     Cervical: No cervical adenopathy.  Skin:    General: Skin is warm and dry.     Findings: No erythema or rash.  Neurological:     General: No focal deficit present.     Mental Status: She is alert and oriented to person, place, and time.  Psychiatric:        Mood and Affect: Mood normal.        Behavior: Behavior normal.    Visual Acuity Right Eye Distance:   Left Eye Distance:   Bilateral Distance:    Right Eye  Near:   Left Eye Near:    Bilateral Near:     UC Couse / Diagnostics / Procedures:    EKG  Radiology No results found.  Procedures Procedures (including critical care time)  UC Diagnoses / Final Clinical Impressions(s)   I have reviewed the triage vital signs and the nursing notes.  Pertinent labs & imaging results that were available during my care of the patient were reviewed by me and considered in my medical decision making (see chart for details).   Final diagnoses:  Mild intermittent reactive airway disease with acute exacerbation  Respiratory infection  Immunosuppression due to drug therapy (HCC)  At risk for infection due to immunosuppression   Physical exam remarkable for turbulent breath sounds, otherwise patient is well-appearing.  Patient provided with a note to return to work.  Patient advised happy to order chest x-ray to rule out any signs for pneumonia.  Patient also advised to follow-up with her rheumatologist to find out what medications would be safe for her to take given her multiple drug allergies and history of MG.  COVID/flu testing performed, will notify patient of results once received.  Tamiflu longer indicated due to duration of symptoms.  ED Prescriptions   None    PDMP not reviewed this encounter.  Pending results:  Labs Reviewed  COVID-19, FLU A+B NAA    Medications Ordered in UC: Medications - No data to display  Disposition Upon Discharge:  Condition: stable for discharge home Home: take medications as prescribed; routine discharge instructions as discussed; follow up as advised.  Patient presented with an acute illness with associated systemic symptoms and significant discomfort requiring urgent management. In my opinion, this is a condition that a prudent lay person (someone who possesses an average knowledge of health and medicine) may potentially expect to result in complications if not addressed urgently such as respiratory distress,  impairment of bodily function or dysfunction of bodily organs.   Routine symptom specific, illness specific and/or disease specific instructions were discussed with the patient and/or caregiver at length.   As such, the patient has been evaluated and assessed, work-up was performed and treatment was provided in alignment with urgent care protocols and evidence based medicine.  Patient/parent/caregiver has been advised that the patient may require follow up for further testing and treatment if the symptoms continue in spite of treatment, as clinically indicated and appropriate.  If the patient was tested for COVID-19, Influenza and/or RSV, then the patient/parent/guardian was advised to isolate at home pending the results of his/her diagnostic coronavirus test and potentially longer if theyre positive. I have also advised pt that if his/her COVID-19 test returns positive, it's recommended to self-isolate for at least 10 days after symptoms first appeared AND until fever-free for 24 hours without fever reducer AND other symptoms have improved or resolved.  Discussed self-isolation recommendations as well as instructions for household member/close contacts as per the [facility] and [place] DHHS, and also gave patient the COVID packet with this information.  Patient/parent/caregiver has been advised to return to the [facility] or PCP in 3-5 days if no better; to PCP or the Emergency Department if new signs and symptoms develop, or if the current signs or symptoms continue to change or worsen for further workup, evaluation and treatment as clinically indicated and appropriate  The patient will follow up with their current PCP if and as advised. If the patient does not currently have a PCP we will assist them in obtaining one.   The patient may need specialty follow up if the symptoms continue, in spite of conservative treatment and management, for further workup, evaluation, consultation and treatment as clinically indicated  and appropriate.  Patient/parent/caregiver verbalized understanding and agreement of plan as discussed.  All questions were addressed during visit.  Please see discharge instructions below for further details of plan.  Discharge Instructions:   Discharge Instructions      At this time, I do not see any safe inhaled steroids or antibiotics for you to take that will address your current upper respiratory symptoms.  I have ordered you an x-ray to be performed at the [facility] location.  We will contact you with the results of the imaging once we receive the report from the radiologist was typically takes an hour or 2.    The results of your COVID and influenza tests will be posted to your MyChart account once complete, this typically takes 24 to 48 hours.  There is a positive result, you will be contacted by phone.  I recommend that you follow-up either with your primary care provider or your rheumatologist to see what medications they would recommend given your history of myasthenia gravis and multiple drug allergies.  Thank you for visiting urgent care today.      This office note has been dictated using Teaching laboratory technician.  Unfortunately, and despite my best efforts, this method of dictation can sometimes lead to occasional typographical or grammatical errors.  I apologize in advance if this occurs.     Theadora Rama Scales, [name] 05/10/21 1437    Theadora Rama Scales, [name] 05/10/21 1505

## 2021-05-11 ENCOUNTER — Telehealth: Payer: Self-pay

## 2021-05-11 LAB — COVID-19, FLU A+B NAA
Influenza A, NAA: NOT DETECTED
Influenza B, NAA: NOT DETECTED
SARS-CoV-2, NAA: DETECTED — AB

## 2021-05-11 NOTE — Telephone Encounter (Signed)
Returned call to pt who states her neurologist told her she could take any cough med and would like provider from yesterday's UC visit to send something in.  Spoke with provider who is uncomfortable with Rx options d/t contraindications listed in chart.  Per provider, advised pt that she could obtain OTC medicine at pharmacy and confirm with pharmacist if it contains anything contraindicated.

## 2021-05-12 NOTE — Progress Notes (Signed)
Please let patient know that her x-ray is completely normal.  Thank you.

## 2021-05-22 ENCOUNTER — Ambulatory Visit (INDEPENDENT_AMBULATORY_CARE_PROVIDER_SITE_OTHER): Payer: BC Managed Care – PPO | Admitting: Family Medicine

## 2021-05-22 ENCOUNTER — Encounter (INDEPENDENT_AMBULATORY_CARE_PROVIDER_SITE_OTHER): Payer: Self-pay | Admitting: Family Medicine

## 2021-05-22 ENCOUNTER — Other Ambulatory Visit: Payer: Self-pay

## 2021-05-22 VITALS — BP 126/87 | HR 98 | Temp 97.8°F | Ht 67.0 in | Wt 265.0 lb

## 2021-05-22 DIAGNOSIS — I509 Heart failure, unspecified: Secondary | ICD-10-CM

## 2021-05-22 DIAGNOSIS — Z6841 Body Mass Index (BMI) 40.0 and over, adult: Secondary | ICD-10-CM | POA: Diagnosis not present

## 2021-05-22 DIAGNOSIS — E669 Obesity, unspecified: Secondary | ICD-10-CM

## 2021-05-22 DIAGNOSIS — Z8669 Personal history of other diseases of the nervous system and sense organs: Secondary | ICD-10-CM

## 2021-05-22 DIAGNOSIS — R7303 Prediabetes: Secondary | ICD-10-CM

## 2021-05-23 NOTE — Progress Notes (Signed)
Chief Complaint:   Sandy Baker is here to discuss her progress with her obesity treatment plan along with follow-up of her obesity related diagnoses. See Medical Weight Management Flowsheet for complete bioelectrical impedance results.  Today's visit was #: 5 Starting weight: 259 lbs Starting date: 02/08/2021 Weight change since last visit: +6 lbs Total lbs lost to date: +6 lbs  Nutrition Plan: Category 2 Plan for 0% of the time.  Activity: None.  Interim History: Sandy Baker says she is not tolerating metformin.  She stopped it in late January.  She is taking a probiotic, Kambucha and a multivitamin.  She has a history of heart failure and does not have a cardiologist currently.  She would like to stay within Sandy Baker.  Assessment/Plan:   1. Other congestive heart failure (Sandy Baker) Sandy Baker does not currently have a cardiologist. She has a Hx of CHF, due to pulmonary HTN, due to faulty CPAP.  Plan:  Will place referral to [facility] for follow up.  2. Prediabetes Not at goal. Goal is HgbA1c < 5.7.  She was unable to tolerate Metformin.  Plan:  Sandy Baker would benefit from a GLP-1RA.  Will clear with Neurology.  She will continue to focus on protein-rich, low simple carbohydrate foods. We reviewed the importance of hydration, regular exercise for stress reduction, and restorative sleep.   Lab Results  Component Value Date   HGBA1C 6.2 (H) 02/08/2021   Lab Results  Component Value Date   INSULIN 173.0 (H) 02/08/2021   3. History of myasthenia gravis Sandy Baker takes Cellcept 750 mg twice daily and gets a rituximab infusion every 6 months.  She is followed by [facility]. We will continue to monitor symptoms as they relate to her weight loss journey.  4. Obesity, current BMI 41.6  Course: Sandy Baker is currently in the action stage of change. As such, her goal is to continue with weight loss efforts.   Nutrition goals: She has agreed to practicing portion control and making  smarter food choices, such as increasing vegetables and decreasing simple carbohydrates.   Exercise goals:  She is working on joining a new gym.  Behavioral modification strategies: increasing lean protein intake, decreasing simple carbohydrates, increasing vegetables, and increasing water intake.  Sandy Baker has agreed to follow-up with our clinic in 4 weeks. She was informed of the importance of frequent follow-up visits to maximize her success with intensive lifestyle modifications for her multiple health conditions.   Objective:   Blood pressure 126/87, pulse 98, temperature 97.8 F (36.6 C), temperature source Oral, height 5\' 7"  (1.702 m), weight 265 lb (120.2 kg), last menstrual period 04/26/2021, SpO2 99 %. Body mass index is 41.5 kg/m.  General: Cooperative, alert, well developed, in no acute distress. HEENT: Conjunctivae and lids unremarkable. Cardiovascular: Regular rhythm.  Lungs: Normal work of breathing. Neurologic: No focal deficits.   Lab Results  Component Value Date   CREATININE 0.91 02/08/2021   BUN 7 02/08/2021   NA 135 02/08/2021   K 4.2 02/08/2021   CL 101 02/08/2021   CO2 20 02/08/2021   Lab Results  Component Value Date   ALT 22 02/08/2021   AST 22 02/08/2021   ALKPHOS 132 (H) 02/08/2021   BILITOT <0.2 02/08/2021   Lab Results  Component Value Date   HGBA1C 6.2 (H) 02/08/2021   Lab Results  Component Value Date   INSULIN 173.0 (H) 02/08/2021   Lab Results  Component Value Date   TSH 0.85 12/28/2016   Lab Results  Component Value Date   VD25OH 9.4 (L) 02/08/2021   Lab Results  Component Value Date   WBC 11.0 (H) 12/28/2016   HGB 10.5 (L) 12/28/2016   HCT 36.7 12/28/2016   MCV 81.7 12/28/2016   PLT 287 12/28/2016   Attestation Statements:   Reviewed by clinician on day of visit: allergies, medications, problem list, medical history, surgical history, family history, social history, and previous encounter notes.  Time spent on visit  including pre-visit chart review and post-visit care and documentation was 43 minutes. Time was spent on: Food choices and timing of food intake reviewed today. I discussed a personalized meal plan with the patient that will help her to lose weight and will improve her obesity-related conditions going forward. I performed a medically necessary appropriate examination and/or evaluation. I discussed the assessment and treatment plan with the patient. Motivational interviewing as well as evidence-based interventions for health behavior change were utilized today including the discussion of self monitoring techniques, problem-solving barriers and SMART goal setting techniques.  An exercise prescription was reviewed.  The patient was provided an opportunity to ask questions and all were answered. The patient agreed with the plan and demonstrated an understanding of the instructions. Clinical information was updated and documented in the EMR.  I, Water quality scientist, CMA, am acting as transcriptionist for Briscoe Deutscher, DO  I have reviewed the above documentation for accuracy and completeness, and I agree with the above. -  Briscoe Deutscher, DO, MS, FAAFP, DABOM - Family and Bariatric Medicine.

## 2021-06-01 ENCOUNTER — Encounter (INDEPENDENT_AMBULATORY_CARE_PROVIDER_SITE_OTHER): Payer: Self-pay | Admitting: Family Medicine

## 2021-06-01 DIAGNOSIS — R7303 Prediabetes: Secondary | ICD-10-CM

## 2021-06-01 MED ORDER — OZEMPIC (0.25 OR 0.5 MG/DOSE) 2 MG/1.5ML ~~LOC~~ SOPN: 0.2500 mg | PEN_INJECTOR | SUBCUTANEOUS | 0 refills | Status: DC

## 2021-06-14 ENCOUNTER — Ambulatory Visit (INDEPENDENT_AMBULATORY_CARE_PROVIDER_SITE_OTHER): Payer: BC Managed Care – PPO | Admitting: Family Medicine

## 2021-06-14 ENCOUNTER — Encounter (INDEPENDENT_AMBULATORY_CARE_PROVIDER_SITE_OTHER): Payer: Self-pay

## 2021-06-27 ENCOUNTER — Ambulatory Visit (INDEPENDENT_AMBULATORY_CARE_PROVIDER_SITE_OTHER): Payer: BC Managed Care – PPO | Admitting: Physician Assistant

## 2021-06-27 ENCOUNTER — Encounter: Payer: Self-pay | Admitting: Physician Assistant

## 2021-06-27 VITALS — BP 114/80 | HR 86 | Temp 98.1°F | Ht 67.0 in | Wt 262.8 lb

## 2021-06-27 DIAGNOSIS — E8881 Metabolic syndrome: Secondary | ICD-10-CM | POA: Diagnosis not present

## 2021-06-27 DIAGNOSIS — G7000 Myasthenia gravis without (acute) exacerbation: Secondary | ICD-10-CM | POA: Diagnosis not present

## 2021-06-27 DIAGNOSIS — G90512 Complex regional pain syndrome I of left upper limb: Secondary | ICD-10-CM

## 2021-06-27 DIAGNOSIS — M797 Fibromyalgia: Secondary | ICD-10-CM

## 2021-06-27 DIAGNOSIS — J4520 Mild intermittent asthma, uncomplicated: Secondary | ICD-10-CM | POA: Diagnosis not present

## 2021-06-27 DIAGNOSIS — J9612 Chronic respiratory failure with hypercapnia: Secondary | ICD-10-CM

## 2021-06-27 DIAGNOSIS — G4733 Obstructive sleep apnea (adult) (pediatric): Secondary | ICD-10-CM

## 2021-06-27 DIAGNOSIS — E88819 Insulin resistance, unspecified: Secondary | ICD-10-CM

## 2021-06-27 DIAGNOSIS — J301 Allergic rhinitis due to pollen: Secondary | ICD-10-CM

## 2021-06-27 DIAGNOSIS — I519 Heart disease, unspecified: Secondary | ICD-10-CM

## 2021-06-27 DIAGNOSIS — K219 Gastro-esophageal reflux disease without esophagitis: Secondary | ICD-10-CM

## 2021-06-27 DIAGNOSIS — I270 Primary pulmonary hypertension: Secondary | ICD-10-CM

## 2021-06-27 MED ORDER — MONTELUKAST SODIUM 10 MG PO TABS: ORAL_TABLET | ORAL | 3 refills | Status: DC

## 2021-06-27 MED ORDER — ALBUTEROL SULFATE 1.25 MG/3ML IN NEBU: 1.0000 | INHALATION_SOLUTION | Freq: Four times a day (QID) | RESPIRATORY_TRACT | 12 refills | Status: DC | PRN

## 2021-06-27 MED ORDER — ALBUTEROL SULFATE HFA 108 (90 BASE) MCG/ACT IN AERS: 2.0000 | INHALATION_SPRAY | Freq: Four times a day (QID) | RESPIRATORY_TRACT | 2 refills | Status: DC | PRN

## 2021-06-27 MED ORDER — LEVOCETIRIZINE DIHYDROCHLORIDE 5 MG PO TABS: ORAL_TABLET | ORAL | 3 refills | Status: DC

## 2021-06-27 NOTE — Progress Notes (Signed)
Sandy MeyerJasmine N Baker is a 36 y.o. female here to establish care. ? ?History of Present Illness:  ? ?Chief Complaint  ?Patient presents with  ? Establish Care  ?  Pt just needed to get under a provider. No complaints.  ? ?Acute Concerns: ?None reported ? ?Chronic Issues: ?Fibromyalgia ?Although she does still have days where she experiences a tingling sensation in her muscles, she is able to live her life fully. She is managing well today.  ? ? ?Insulin Resistance ?Following routine labs work completed on 02/08/21, pt was found to have an elevated HgbA1c of 6.2 and elevated insulin level of 173.0. Upon further evaluation, she was found to have an elevated insulin level caused by prolonged use of prednisone in her past. Due to this she was started on metformin XR 500 mg daily but soon stopped this since the medication caused her GI upset. As a result of this she was transitioned to ozempic 0.25 mg weekly injection. Currently she states she has noticed that her appetite has been decreased while on the medication but nothing else. She is set to increase her dosage in 2 weeks and is looking forward to seeing if she'll finally see some weight loss.  ? ? ?Myasthenia Gravis  ?Pt states that she first began to experience sx of this in high school which was double vision. In an effort to manage this, she did visit an optometrist and was given glasses, but this provided no relief. As time went on she started to lose weight, from 160 to 103 pounds within 2 years. Due to this she visited multiple specialist and was eventually dx with myasthenia gravis. 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 Currently she is compliant with taking cellcept 750 mg BID and receiving Trumixa Ivs with no complications. She is also regularly following up with Dr. Thomasena Edisollins, neurology and is managing well.  ? ? ?Complex Regional Pain Syndrome  ?Occurred after UCL repair in Aug 2022. Sandy Baker was  previously trialing gabapentin 300 mg for this issue but due to finding no relief, she decided to stop use. At this time she is not being treated for this issue due to being able to deal with it. She does admit to experiencing some sporadic pain flare ups, but reports it's "not that bad". She is managing well at this time.  ? ? ?GERD ?At one point, Sandy Baker was compliant with taking protonix 40 mg daily with no complications, but due to her previous provider's office closing she had to stop use. Since stopping the medication, she has felt her sx have remained controlled which led to her decision to not restart this medication at this time. Denies concerning sx.  ? ? ?OSA ?Pt is currently on a CPAP machine and has found this beneficial in treating this issue. She is regularly following up with Dr.Taxman, pulmonology for this issue and is managing well.  ? ? ?Chronic Resp Failure; Pulmonary Hypertension ?According to pt, years ago she had experienced heart failure following her initial sleep study which was faulty as well as then dx pulmonary hypertension. States that her oxygen level got as low as 20% despite her feeling fine and functioning well. Due to this she was admitted to [facility] and received oxygen in the ICU for 2-3 days. While in the hospital, Sandy Baker reports they started her on Trilogy and that was when Dr. Doyce Looseaxman, pulmonology began taking over her pulmonary hypertension management. She is set  to have a follow up ECHO during her initial visit with Dr. Michae Kava, cardiology in April of 2023.  ? ? ?Asthma; Allergic Rhinitis ?Sandy Baker has remained compliant with taking singulair 10 mg daily, Xyzal 5 mg daily, accuned every 6 hours prn, and albuterol 108 mcg base inhaler every 6 hours prn with no complications. Pt states she usually has not problems with these issues unless she has a respiratory infection. In addition to her other medications, Sandy Baker also has an EpiPen 0.3 mg for emergencies. She is  tolerating these medications well and will need a refill of them today.  ? ? ?Vitamin D Deficiency ?Pt is currently compliant with taking Drisdol 1.25 mg daily with no complications. Following her labs on 02/08/21, she was found to have a decreased vitamin D level of 9.4. At this time she is tolerating well.  ? ? ?Migraines ?When it comes to her migraines, Sandy Baker states they can last up to a couple days and always cause her to have sensitive hearing. Reports that she can't even be a room which a clock due to the noise driving her crazy. She has previously trialed Ubrelvy 100 mg but found the medication provided no relief. Due to this she stopped use and restarted use of OTC migraine relief medication. While this doesn't help as much, she is able to tolerate the pain and function. She is tolerating well.  ? ? ?Hx of Right Ventricular Dysfunction ?Following a routine echocardiogram on 04/08/2017, pt was found to have a mildly enlarged right ventricular measuring 3.1 cm. Although this has not caused her any problems, she is regularly having this monitored to ensure nothing significant is occurring.  ? ?Depression screen [facility] 2/9 02/08/2021  ?Decreased Interest 0  ?Down, Depressed, Hopeless 0  ?PHQ - 2 Score 0  ?Altered sleeping 1  ?Tired, decreased energy 0  ?Change in appetite 0  ?Feeling bad or failure about yourself  0  ?Trouble concentrating 0  ?Moving slowly or fidgety/restless 0  ?Suicidal thoughts 0  ?PHQ-9 Score 1  ?Difficult doing work/chores Not difficult at all  ? ? ?No flowsheet data found. ? ? ?Other providers/specialists: ?Patient Care Team: ?Jarold Motto, PA as PCP - General (Physician Assistant)  ? ?Past Medical History:  ?Diagnosis Date  ? Acute respiratory failure (HCC)   ? With hypoxia and hypercapnea  ? Allergic rhinitis   ? Allergy   ? Asthma   ? Autoimmune disease (HCC)   ? B12 deficiency   ? Chiari I malformation (HCC)   ? Congestive heart failure (HCC)   ? Depression   ? Fibromyalgia   ? GERD  (gastroesophageal reflux disease)   ? Heartburn   ? Hypertension   ? Pulmonary hypertension  ? Migraines   ? Multiple food allergies   ? Myasthenia gravis (HCC)   ? Prediabetes   ? Pulmonary hypertension (HCC)   ? Right ventricular dysfunction   ? Sleep apnea   ? TMJ (dislocation of temporomandibular joint)   ? ?  ?Social History  ? ?Tobacco Use  ? Smoking status: Never  ? Smokeless tobacco: Never  ?Vaping Use  ? Vaping Use: Never used  ?Substance Use Topics  ? Alcohol use: No  ? Drug use: No  ? ? ?Past Surgical History:  ?Procedure Laterality Date  ? CARDIAC CATHETERIZATION    ? 05-2017  ? FRACTURE SURGERY  12/05/20  ? UCL repair  ? THYMECTOMY  2009  ? ? ?Family History  ?Problem Relation Age of Onset  ?  Hypertension Mother   ? Depression Mother   ? Asthma Mother   ? Bipolar disorder Mother   ? Thyroid disease Mother   ? Anxiety disorder Mother   ? Sleep apnea Mother   ? Obesity Father   ? High blood pressure Father   ? Aortic aneurysm Father   ? Asthma Sister   ? Tremor Sister   ?     essential  ? Glaucoma Maternal Grandmother   ? Heart attack Paternal Grandmother   ? Hypertension Paternal Grandmother   ? Heart failure Paternal Grandfather   ? Breast cancer Cousin   ? ? ?Allergies  ?Allergen Reactions  ? Aminoglycosides Other (See Comments)  ?  Myasthenia Gravis alert ?Myasthenia Gravis alert ?  ? Amoxicillin Swelling  ? Beta Adrenergic Blockers Other (See Comments)  ?  Myasthenia Gravis alert ?Myasthenia Gravis alert ?  ? Bioflavonoids Hives  ? Macrolides And Ketolides Other (See Comments)  ?  Myasthenia Gravis alert ?Myasthenia Gravis alert ?  ? Magnesium Anaphylaxis and Other (See Comments)  ?  Myasthenia Gravis alert ?Myasthenia Gravis alert ?  ? Mometasone Furo-Formoterol Fum Swelling  ?  Patient tongue swells ?Patient tongue swells ?  ? Onabotulinumtoxina Other (See Comments)  ?  Myasthenia Gravis alert ?Myasthenia Gravis alert ?  ? Penicillamine Other (See Comments)  ?  Myasthenia Gravis alert  ? Penicillins  Swelling  ? Povidone-Iodine Hives  ? Procainamide Other (See Comments)  ?  Myasthenia Gravis alert ?Myasthenia Gravis alert ?  ? Quinolones Other (See Comments)  ?  Myasthenia Gravis alert ?Myasthenia Gravis a

## 2021-07-04 ENCOUNTER — Ambulatory Visit (INDEPENDENT_AMBULATORY_CARE_PROVIDER_SITE_OTHER): Payer: BC Managed Care – PPO | Admitting: Family Medicine

## 2021-07-04 ENCOUNTER — Encounter (INDEPENDENT_AMBULATORY_CARE_PROVIDER_SITE_OTHER): Payer: Self-pay | Admitting: Family Medicine

## 2021-07-04 ENCOUNTER — Other Ambulatory Visit: Payer: Self-pay

## 2021-07-04 VITALS — BP 115/73 | HR 94 | Temp 97.9°F | Ht 67.0 in | Wt 254.0 lb

## 2021-07-04 DIAGNOSIS — E8881 Metabolic syndrome: Secondary | ICD-10-CM

## 2021-07-04 DIAGNOSIS — E559 Vitamin D deficiency, unspecified: Secondary | ICD-10-CM

## 2021-07-04 DIAGNOSIS — I10 Essential (primary) hypertension: Secondary | ICD-10-CM

## 2021-07-04 DIAGNOSIS — R5383 Other fatigue: Secondary | ICD-10-CM

## 2021-07-04 DIAGNOSIS — Z79899 Other long term (current) drug therapy: Secondary | ICD-10-CM

## 2021-07-04 DIAGNOSIS — I519 Heart disease, unspecified: Secondary | ICD-10-CM | POA: Diagnosis not present

## 2021-07-04 DIAGNOSIS — E669 Obesity, unspecified: Secondary | ICD-10-CM

## 2021-07-04 DIAGNOSIS — Z6839 Body mass index (BMI) 39.0-39.9, adult: Secondary | ICD-10-CM

## 2021-07-04 DIAGNOSIS — E88819 Insulin resistance, unspecified: Secondary | ICD-10-CM

## 2021-07-05 LAB — INSULIN, RANDOM: INSULIN: 16 u[IU]/mL (ref 2.6–24.9)

## 2021-07-05 LAB — LIPASE: Lipase: 32 U/L (ref 14–72)

## 2021-07-05 LAB — LIPID PANEL
Chol/HDL Ratio: 3.6 ratio (ref 0.0–4.4)
Cholesterol, Total: 155 mg/dL (ref 100–199)
HDL: 43 mg/dL (ref >39)
LDL Chol Calc (NIH): 95 mg/dL (ref 0–99)
Triglycerides: 91 mg/dL (ref 0–149)
VLDL Cholesterol Cal: 17 mg/dL (ref 5–40)

## 2021-07-05 LAB — ANEMIA PANEL
Ferritin: 18 ng/mL (ref 15–150)
Folate, Hemolysate: 532 ng/mL
Folate, RBC: 1337 ng/mL (ref >498)
Hematocrit: 39.8 % (ref 34.0–46.6)
Iron Saturation: 6 % — CL (ref 15–55)
Iron: 29 ug/dL (ref 27–159)
Retic Ct Pct: 1 % (ref 0.6–2.6)
Total Iron Binding Capacity: 460 ug/dL — ABNORMAL HIGH (ref 250–450)
UIBC: 431 ug/dL — ABNORMAL HIGH (ref 131–425)
Vitamin B-12: 452 pg/mL (ref 232–1245)

## 2021-07-05 LAB — VITAMIN D 25 HYDROXY (VIT D DEFICIENCY, FRACTURES): Vit D, 25-Hydroxy: 20.3 ng/mL — ABNORMAL LOW (ref 30.0–100.0)

## 2021-07-05 LAB — HEMOGLOBIN A1C
Est. average glucose Bld gHb Est-mCnc: 114 mg/dL
Hgb A1c MFr Bld: 5.6 % (ref 4.8–5.6)

## 2021-07-05 MED ORDER — OZEMPIC (1 MG/DOSE) 4 MG/3ML ~~LOC~~ SOPN: 1.0000 mg | PEN_INJECTOR | SUBCUTANEOUS | 0 refills | Status: DC

## 2021-07-05 MED ORDER — OZEMPIC (1 MG/DOSE) 4 MG/3ML ~~LOC~~ SOPN: 1.0000 mg | PEN_INJECTOR | SUBCUTANEOUS | 3 refills | Status: DC

## 2021-07-06 NOTE — Progress Notes (Signed)
Chief Complaint:   Sandy Baker is here to discuss her progress with her obesity treatment plan along with follow-up of her obesity related diagnoses.   Today's visit was #: 6 Starting weight: 259 lbs Starting date: 02/08/2021 Today's weight: 254 lbs Today's date: 07/04/2021 Weight change since last visit: -11 lbs Total lbs lost to date: 5 lbs Body mass index is 39.78 kg/m.  Total weight loss percentage to date: -1.93%  Current Meal Plan: the Category 2 Plan for 0% of the time.  Current Exercise Plan: None at this time Current Anti-Obesity Medications: Ozempic 0.25 mg subcutaneously weekly. Side effects: None.  Interim History: Jazzmyn reports that she is doing a Whole 30 diet with her dad to help him with weight loss. She is currently observing Ramadan. She also likes her new PCP.  Assessment/Plan:   1. Insulin resistance Improving, but not optimized. Goal is HgbA1c < 5.7, fasting insulin closer to 5.  Medication: Ozempic 0.25 mg subcutaneously weekly.    Plan:  Increase Ozempic to 1 mg subcutaneously weekly. We will check labs today. She will continue to focus on protein-rich, low simple carbohydrate foods. We reviewed the importance of hydration, regular exercise for stress reduction, and restorative sleep.   Lab Results  Component Value Date   INSULIN 173.0 (H) 02/08/2021   - Hemoglobin A1c - Insulin, random - Start Semaglutide, 1 MG/DOSE, (OZEMPIC, 1 MG/DOSE,) 4 MG/3ML SOPN; Inject 1 mg into the skin once a week.  Dispense: 3 mL; Refill: 3  2. Essential hypertension At goal. Medications: verapamil 240 mg daily and Atacand 16 mg daily.   Plan: Avoid buying foods that are: processed, frozen, or prepackaged to avoid excess salt. We will watch for signs of hypotension as she continues lifestyle modifications.  BP Readings from Last 3 Encounters:  07/04/21 115/73  06/27/21 114/80  05/22/21 126/87   Lab Results  Component Value Date   CREATININE 0.91  02/08/2021   3. Vitamin D deficiency Not at goal.   Plan: We will check labs today. Follow-up for routine testing of Vitamin D, at least 2-3 times per year to avoid over-replacement.  Lab Results  Component Value Date   VD25OH 9.4 (L) 02/08/2021   - VITAMIN D 25 Hydroxy (Vit-D Deficiency, Fractures)  4. Right ventricular dysfunction We will continue to monitor symptoms as they relate to her weight loss journey.  Plan: We will check labs today.  - Lipid panel  5. Other fatigue We will continue to monitor symptoms as they relate to her weight loss journey.  Plan: We will check labs today.  - Anemia panel  6. Medication management We will continue to monitor symptoms as they relate to her weight loss journey.  Plan: We will check labs today.  - Lipase  7. Obesity, current BMI 39.8 Course: Mirlande is currently in the action stage of change. As such, her goal is to continue with weight loss efforts.   Nutrition goals: She has agreed to Whole 30.   Exercise goals: Increase NEAT  Behavioral modification strategies: increasing lean protein intake, decreasing simple carbohydrates, increasing vegetables, increasing water intake, and decreasing liquid calories.  Sherri has agreed to follow-up with our clinic in 4 weeks. She was informed of the importance of frequent follow-up visits to maximize her success with intensive lifestyle modifications for her multiple health conditions.   Ameliah was informed we would discuss her lab results at her next visit unless there is a critical issue that needs to be  addressed sooner. Ettie agreed to keep her next visit at the agreed upon time to discuss these results.  Objective:   Blood pressure 115/73, pulse 94, temperature 97.9 F (36.6 C), temperature source Oral, height 5\' 7"  (1.702 m), weight 254 lb (115.2 kg), SpO2 99 %. Body mass index is 39.78 kg/m.  General: Cooperative, alert, well developed, in no acute distress. HEENT:  Conjunctivae and lids unremarkable. Cardiovascular: Regular rhythm.  Lungs: Normal work of breathing. Neurologic: No focal deficits.   Lab Results  Component Value Date   CREATININE 0.91 02/08/2021   BUN 7 02/08/2021   NA 135 02/08/2021   K 4.2 02/08/2021   CL 101 02/08/2021   CO2 20 02/08/2021   Lab Results  Component Value Date   ALT 22 02/08/2021   AST 22 02/08/2021   ALKPHOS 132 (H) 02/08/2021   BILITOT <0.2 02/08/2021   Lab Results  Component Value Date   HGBA1C 6.2 (H) 02/08/2021   Lab Results  Component Value Date   INSULIN 173.0 (H) 02/08/2021   Lab Results  Component Value Date   TSH 0.85 12/28/2016   Lab Results  Component Value Date   VD25OH 9.4 (L) 02/08/2021   Lab Results  Component Value Date   WBC 11.0 (H) 12/28/2016   HGB 10.5 (L) 12/28/2016   MCV 81.7 12/28/2016   PLT 287 12/28/2016   Attestation Statements:   Reviewed by clinician on day of visit: allergies, medications, problem list, medical history, surgical history, family history, social history, and previous encounter notes.  Leodis Binet Friedenbach, CMA, am acting as Location manager for [employer], DO.  I have reviewed the above documentation for accuracy and completeness, and I agree with the above. -  Briscoe Deutscher, DO, MS, FAAFP, DABOM - Family and Bariatric Medicine.

## 2021-07-17 ENCOUNTER — Telehealth: Payer: Self-pay

## 2021-07-17 ENCOUNTER — Telehealth (INDEPENDENT_AMBULATORY_CARE_PROVIDER_SITE_OTHER): Payer: Self-pay | Admitting: Family Medicine

## 2021-07-17 NOTE — Telephone Encounter (Signed)
Patient states that Dr. Earlene Plater has already left [facility] and is waiting on Dr. Philis Pique office to open at [place].  States Dr. Earlene Plater sent incorrect dose of Ozempic to her pharmacy 0.5 of ozempic to [facility] on [address].

## 2021-07-17 NOTE — Telephone Encounter (Signed)
Spoke to pt told her Dr. Earlene Plater did send in prescription on 3/29 for Ozempic 1 mg according to her note at your last office visit on 3/28 she wrote for you to increase to 1 mg weekly. Pt verbalized understanding and said she thought she told her to do 0.5 mg weekly. Told her it say 1 mg but you may want to send her a My Chart message to clarify with her. Pt verbalized understanding. ? ?

## 2021-07-17 NOTE — Telephone Encounter (Signed)
Patient is out of Ozempic 1mg , but she needs the .5 mg. Please call patient. Patient is a week out of meds, and wants to know if she would need to start over again. Call pt at [phone]. ?

## 2021-07-17 NOTE — Telephone Encounter (Signed)
Patient has called back in regard.  Patient is upset that she called this morning and that Sam has not addressed this refill yet.  I have explained to patient the refill policy.  I have also informed her that we will try to get refilled.  Patient states this is diabetic medication and that she has already been a day without this med.

## 2021-07-18 MED ORDER — INSULIN PEN NEEDLE 32G X 4 MM MISC: 1.0000 | 0 refills | Status: DC

## 2021-07-18 MED ORDER — ONDANSETRON 4 MG PO TBDP: 4.0000 mg | ORAL_TABLET | Freq: Three times a day (TID) | ORAL | 0 refills | Status: AC | PRN

## 2021-07-18 NOTE — Telephone Encounter (Signed)
Last OV with Dr Wallace 

## 2021-07-18 NOTE — Telephone Encounter (Signed)
Spoke with pt and told her how to count clicks with the 1 mg pen to get to 0.5 mg dose. Pt reported nausea/vomiting.  ? ?Extra pen needle sent to pharmacy. ?Zofran sent to pharmacy to help with nausea/vomiting. ?

## 2021-08-08 ENCOUNTER — Other Ambulatory Visit: Payer: Self-pay | Admitting: Physician Assistant

## 2021-08-08 DIAGNOSIS — J4520 Mild intermittent asthma, uncomplicated: Secondary | ICD-10-CM

## 2021-11-07 DIAGNOSIS — G4733 Obstructive sleep apnea (adult) (pediatric): Secondary | ICD-10-CM | POA: Diagnosis not present

## 2021-11-07 DIAGNOSIS — I10 Essential (primary) hypertension: Secondary | ICD-10-CM | POA: Diagnosis not present

## 2021-11-07 DIAGNOSIS — E559 Vitamin D deficiency, unspecified: Secondary | ICD-10-CM | POA: Diagnosis not present

## 2021-11-07 DIAGNOSIS — J45909 Unspecified asthma, uncomplicated: Secondary | ICD-10-CM | POA: Diagnosis not present

## 2021-11-15 ENCOUNTER — Encounter (INDEPENDENT_AMBULATORY_CARE_PROVIDER_SITE_OTHER): Payer: Self-pay

## 2021-11-16 DIAGNOSIS — G7000 Myasthenia gravis without (acute) exacerbation: Secondary | ICD-10-CM | POA: Diagnosis not present

## 2021-11-16 DIAGNOSIS — J449 Chronic obstructive pulmonary disease, unspecified: Secondary | ICD-10-CM | POA: Diagnosis not present

## 2021-11-16 DIAGNOSIS — J9610 Chronic respiratory failure, unspecified whether with hypoxia or hypercapnia: Secondary | ICD-10-CM | POA: Diagnosis not present

## 2021-11-19 DIAGNOSIS — G7000 Myasthenia gravis without (acute) exacerbation: Secondary | ICD-10-CM | POA: Diagnosis not present

## 2021-12-17 DIAGNOSIS — G7000 Myasthenia gravis without (acute) exacerbation: Secondary | ICD-10-CM | POA: Diagnosis not present

## 2021-12-17 DIAGNOSIS — J9610 Chronic respiratory failure, unspecified whether with hypoxia or hypercapnia: Secondary | ICD-10-CM | POA: Diagnosis not present

## 2021-12-17 DIAGNOSIS — J449 Chronic obstructive pulmonary disease, unspecified: Secondary | ICD-10-CM | POA: Diagnosis not present

## 2022-01-02 DIAGNOSIS — G4733 Obstructive sleep apnea (adult) (pediatric): Secondary | ICD-10-CM | POA: Diagnosis not present

## 2022-01-02 DIAGNOSIS — M79673 Pain in unspecified foot: Secondary | ICD-10-CM | POA: Diagnosis not present

## 2022-01-02 DIAGNOSIS — G7000 Myasthenia gravis without (acute) exacerbation: Secondary | ICD-10-CM | POA: Diagnosis not present

## 2022-01-02 DIAGNOSIS — I10 Essential (primary) hypertension: Secondary | ICD-10-CM | POA: Diagnosis not present

## 2022-01-12 ENCOUNTER — Ambulatory Visit: Payer: BC Managed Care – PPO | Admitting: Obstetrics & Gynecology

## 2022-01-16 DIAGNOSIS — G7000 Myasthenia gravis without (acute) exacerbation: Secondary | ICD-10-CM | POA: Diagnosis not present

## 2022-01-16 DIAGNOSIS — J9610 Chronic respiratory failure, unspecified whether with hypoxia or hypercapnia: Secondary | ICD-10-CM | POA: Diagnosis not present

## 2022-01-16 DIAGNOSIS — J449 Chronic obstructive pulmonary disease, unspecified: Secondary | ICD-10-CM | POA: Diagnosis not present

## 2022-01-17 ENCOUNTER — Ambulatory Visit (INDEPENDENT_AMBULATORY_CARE_PROVIDER_SITE_OTHER): Payer: BC Managed Care – PPO | Admitting: Obstetrics & Gynecology

## 2022-01-17 ENCOUNTER — Other Ambulatory Visit (HOSPITAL_COMMUNITY)
Admission: RE | Admit: 2022-01-17 | Discharge: 2022-01-17 | Disposition: A | Discharge status: Home / Self Care | Payer: BC Managed Care – PPO | Source: Ambulatory Visit | Attending: Obstetrics & Gynecology | Admitting: Obstetrics & Gynecology

## 2022-01-17 ENCOUNTER — Encounter: Payer: Self-pay | Admitting: Obstetrics & Gynecology

## 2022-01-17 VITALS — BP 126/82 | HR 82 | Ht 67.25 in | Wt 242.0 lb

## 2022-01-17 DIAGNOSIS — Z308 Encounter for other contraceptive management: Secondary | ICD-10-CM

## 2022-01-17 DIAGNOSIS — Z01419 Encounter for gynecological examination (general) (routine) without abnormal findings: Secondary | ICD-10-CM

## 2022-01-17 NOTE — Progress Notes (Signed)
Sandy Baker Feb 19, 1986 [id number]   History:    36 y.o.  G0 Single  RP:  Established patient for Annual Gyn exam  HPI:  Menses regular normal every month.  No BTB. Abstinent.  Pap smear Neg 12/2020.  No history of abnormal Pap smears.  Pap reflex today.  Breasts normal now. [place] Left Breast Bx 01/2020.  Lt breast US 01/2021.  Will resume Mammo at age 88. BMI improved to 37.62.  Followed at [facility] for Myasthenia Gravis.    Past medical history,surgical history, family history and social history were all reviewed and documented in the EPIC chart.  Gynecologic History Patient's last menstrual period was 01/07/2022 (exact date).  Obstetric History OB History  Gravida Para Term Preterm AB Living  0            SAB IAB Ectopic Multiple Live Births                ROS: A ROS was performed and pertinent positives and negatives are included in the history. GENERAL: No fevers or chills. HEENT: No change in vision, no earache, sore throat or sinus congestion. NECK: No pain or stiffness. CARDIOVASCULAR: No chest pain or pressure. No palpitations. PULMONARY: No shortness of breath, cough or wheeze. GASTROINTESTINAL: No abdominal pain, nausea, vomiting or diarrhea, melena or bright red blood per rectum. GENITOURINARY: No urinary frequency, urgency, hesitancy or dysuria. MUSCULOSKELETAL: No joint or muscle pain, no back pain, no recent trauma. DERMATOLOGIC: No rash, no itching, no lesions. ENDOCRINE: No polyuria, polydipsia, no heat or cold intolerance. No recent change in weight. HEMATOLOGICAL: No anemia or easy bruising or bleeding. NEUROLOGIC: No headache, seizures, numbness, tingling or weakness. PSYCHIATRIC: No depression, no loss of interest in normal activity or change in sleep pattern.     Exam:   BP 126/82   Pulse 82   Ht 5' 7.25" (1.708 m)   Wt 242 lb (109.8 kg)   LMP 01/07/2022 (Exact Date)   SpO2 98%   BMI 37.62 kg/m   Body mass index is 37.62 kg/m.  General appearance :  Well developed well nourished female. No acute distress HEENT: Eyes: no retinal hemorrhage or exudates,  Neck supple, trachea midline, no carotid bruits, no thyroidmegaly Lungs: Clear to auscultation, no rhonchi or wheezes, or rib retractions  Heart: Regular rate and rhythm, no murmurs or gallops Breast:Examined in sitting and supine position were symmetrical in appearance, no palpable masses or tenderness,  no skin retraction, no nipple inversion, no nipple discharge, no skin discoloration, no axillary or supraclavicular lymphadenopathy Abdomen: no palpable masses or tenderness, no rebound or guarding Extremities: no edema or skin discoloration or tenderness  Pelvic: Vulva: Normal             Vagina: No gross lesions or discharge  Cervix: No gross lesions or discharge.  Pap reflex done.  Uterus  RV, normal size, shape and consistency, non-tender and mobile  Adnexa  Without masses or tenderness  Anus: Normal   Assessment/Plan:  36 y.o. female for annual exam   1. Encounter for routine gynecological examination with Papanicolaou smear of cervix Menses regular normal every month.  No BTB. Abstinent.  Pap smear Neg 12/2020.  No history of abnormal Pap smears.  Pap reflex today.  Breasts normal now. [place] Left Breast Bx 01/2020.  Lt breast US 01/2021.  Will resume Mammo at age 41. BMI improved to 37.62.  Followed at [facility] for Myasthenia Gravis.   - Cytology - PAP( [place])  2. Encounter for other contraceptive management Currently abstinent.  3. Class 2 severe obesity due to excess calories with serious comorbidity and body mass index (BMI) of 37.0 to 37.9 in adult (HCC) BMI improved to 37.62.  Hoping to start on Wegovy.  Other orders - etodolac (LODINE) 500 MG tablet; Take 500 mg by mouth 2 (two) times daily. - candesartan (ATACAND) 32 MG tablet; Take by mouth.   Genia Del MD, 8:37 AM 01/17/2022

## 2022-01-18 LAB — CYTOLOGY - PAP: Diagnosis: NEGATIVE

## 2022-01-22 DIAGNOSIS — M79671 Pain in right foot: Secondary | ICD-10-CM | POA: Diagnosis not present

## 2022-01-22 DIAGNOSIS — R7303 Prediabetes: Secondary | ICD-10-CM | POA: Diagnosis not present

## 2022-01-22 DIAGNOSIS — M722 Plantar fascial fibromatosis: Secondary | ICD-10-CM | POA: Diagnosis not present

## 2022-01-22 DIAGNOSIS — M797 Fibromyalgia: Secondary | ICD-10-CM | POA: Diagnosis not present

## 2022-01-24 DIAGNOSIS — R7303 Prediabetes: Secondary | ICD-10-CM | POA: Diagnosis not present

## 2022-01-24 DIAGNOSIS — G4733 Obstructive sleep apnea (adult) (pediatric): Secondary | ICD-10-CM | POA: Diagnosis not present

## 2022-01-24 DIAGNOSIS — I10 Essential (primary) hypertension: Secondary | ICD-10-CM | POA: Diagnosis not present

## 2022-01-24 DIAGNOSIS — Z6837 Body mass index (BMI) 37.0-37.9, adult: Secondary | ICD-10-CM | POA: Diagnosis not present

## 2022-01-24 DIAGNOSIS — G7000 Myasthenia gravis without (acute) exacerbation: Secondary | ICD-10-CM | POA: Diagnosis not present

## 2022-01-24 DIAGNOSIS — I272 Pulmonary hypertension, unspecified: Secondary | ICD-10-CM | POA: Diagnosis not present

## 2022-01-30 DIAGNOSIS — G7000 Myasthenia gravis without (acute) exacerbation: Secondary | ICD-10-CM | POA: Diagnosis not present

## 2022-02-05 DIAGNOSIS — M79672 Pain in left foot: Secondary | ICD-10-CM | POA: Diagnosis not present

## 2022-02-05 DIAGNOSIS — M79671 Pain in right foot: Secondary | ICD-10-CM | POA: Diagnosis not present

## 2022-02-05 DIAGNOSIS — G7000 Myasthenia gravis without (acute) exacerbation: Secondary | ICD-10-CM | POA: Diagnosis not present

## 2022-02-05 DIAGNOSIS — I519 Heart disease, unspecified: Secondary | ICD-10-CM | POA: Diagnosis not present

## 2022-02-16 DIAGNOSIS — J9610 Chronic respiratory failure, unspecified whether with hypoxia or hypercapnia: Secondary | ICD-10-CM | POA: Diagnosis not present

## 2022-02-16 DIAGNOSIS — J449 Chronic obstructive pulmonary disease, unspecified: Secondary | ICD-10-CM | POA: Diagnosis not present

## 2022-02-16 DIAGNOSIS — G7000 Myasthenia gravis without (acute) exacerbation: Secondary | ICD-10-CM | POA: Diagnosis not present

## 2022-02-23 DIAGNOSIS — M722 Plantar fascial fibromatosis: Secondary | ICD-10-CM | POA: Diagnosis not present

## 2022-02-23 DIAGNOSIS — M7732 Calcaneal spur, left foot: Secondary | ICD-10-CM | POA: Diagnosis not present

## 2022-02-23 DIAGNOSIS — M7731 Calcaneal spur, right foot: Secondary | ICD-10-CM | POA: Diagnosis not present

## 2022-03-06 DIAGNOSIS — M722 Plantar fascial fibromatosis: Secondary | ICD-10-CM | POA: Diagnosis not present

## 2022-03-06 DIAGNOSIS — M797 Fibromyalgia: Secondary | ICD-10-CM | POA: Diagnosis not present

## 2022-03-14 DIAGNOSIS — M722 Plantar fascial fibromatosis: Secondary | ICD-10-CM | POA: Diagnosis not present

## 2022-03-14 DIAGNOSIS — R7303 Prediabetes: Secondary | ICD-10-CM | POA: Diagnosis not present

## 2022-03-14 DIAGNOSIS — R262 Difficulty in walking, not elsewhere classified: Secondary | ICD-10-CM | POA: Diagnosis not present

## 2022-03-14 DIAGNOSIS — D72828 Other elevated white blood cell count: Secondary | ICD-10-CM | POA: Diagnosis not present

## 2022-03-14 DIAGNOSIS — M797 Fibromyalgia: Secondary | ICD-10-CM | POA: Diagnosis not present

## 2022-03-14 DIAGNOSIS — E559 Vitamin D deficiency, unspecified: Secondary | ICD-10-CM | POA: Diagnosis not present

## 2022-03-18 DIAGNOSIS — J9610 Chronic respiratory failure, unspecified whether with hypoxia or hypercapnia: Secondary | ICD-10-CM | POA: Diagnosis not present

## 2022-03-18 DIAGNOSIS — G7000 Myasthenia gravis without (acute) exacerbation: Secondary | ICD-10-CM | POA: Diagnosis not present

## 2022-03-18 DIAGNOSIS — J449 Chronic obstructive pulmonary disease, unspecified: Secondary | ICD-10-CM | POA: Diagnosis not present

## 2022-03-21 DIAGNOSIS — M722 Plantar fascial fibromatosis: Secondary | ICD-10-CM | POA: Diagnosis not present

## 2022-03-22 DIAGNOSIS — M722 Plantar fascial fibromatosis: Secondary | ICD-10-CM | POA: Diagnosis not present

## 2022-03-23 DIAGNOSIS — M722 Plantar fascial fibromatosis: Secondary | ICD-10-CM | POA: Diagnosis not present

## 2022-03-23 DIAGNOSIS — R262 Difficulty in walking, not elsewhere classified: Secondary | ICD-10-CM | POA: Diagnosis not present

## 2022-03-23 DIAGNOSIS — M797 Fibromyalgia: Secondary | ICD-10-CM | POA: Diagnosis not present

## 2022-03-30 DIAGNOSIS — M722 Plantar fascial fibromatosis: Secondary | ICD-10-CM | POA: Diagnosis not present

## 2022-04-03 DIAGNOSIS — M722 Plantar fascial fibromatosis: Secondary | ICD-10-CM | POA: Diagnosis not present

## 2022-04-05 DIAGNOSIS — M722 Plantar fascial fibromatosis: Secondary | ICD-10-CM | POA: Diagnosis not present

## 2022-04-11 DIAGNOSIS — M722 Plantar fascial fibromatosis: Secondary | ICD-10-CM | POA: Diagnosis not present

## 2022-04-13 DIAGNOSIS — M722 Plantar fascial fibromatosis: Secondary | ICD-10-CM | POA: Diagnosis not present

## 2022-04-18 DIAGNOSIS — G7000 Myasthenia gravis without (acute) exacerbation: Secondary | ICD-10-CM | POA: Diagnosis not present

## 2022-04-18 DIAGNOSIS — J9610 Chronic respiratory failure, unspecified whether with hypoxia or hypercapnia: Secondary | ICD-10-CM | POA: Diagnosis not present

## 2022-04-18 DIAGNOSIS — J449 Chronic obstructive pulmonary disease, unspecified: Secondary | ICD-10-CM | POA: Diagnosis not present

## 2022-04-24 ENCOUNTER — Ambulatory Visit: Payer: BC Managed Care – PPO | Admitting: Physician Assistant

## 2022-04-24 ENCOUNTER — Encounter: Payer: Self-pay | Admitting: Physician Assistant

## 2022-04-24 VITALS — BP 122/80 | HR 92 | Temp 98.0°F | Ht 67.25 in | Wt 247.4 lb

## 2022-04-24 DIAGNOSIS — Z7184 Encounter for health counseling related to travel: Secondary | ICD-10-CM | POA: Diagnosis not present

## 2022-04-24 DIAGNOSIS — Z23 Encounter for immunization: Secondary | ICD-10-CM

## 2022-04-24 DIAGNOSIS — M722 Plantar fascial fibromatosis: Secondary | ICD-10-CM | POA: Diagnosis not present

## 2022-04-24 DIAGNOSIS — T7840XD Allergy, unspecified, subsequent encounter: Secondary | ICD-10-CM | POA: Diagnosis not present

## 2022-04-24 MED ORDER — EPINEPHRINE 0.3 MG/0.3ML IJ SOAJ: 0.3000 mg | INTRAMUSCULAR | 2 refills | Status: DC | PRN

## 2022-04-24 NOTE — Addendum Note (Signed)
Addended by: Marian Sorrow on: 04/24/2022 11:16 AM   Modules accepted: Orders

## 2022-04-24 NOTE — Progress Notes (Signed)
Sandy Baker is a 37 y.o. female here for a new problem.  History of Present Illness:   Chief Complaint  Patient presents with   Medication Refill    She is traveling to [place] in December for a friend's wedding.  She will be there for a week.  She has never travel to [place].  She has been discussing possible travel medications and immunizations that may be required for this visit with her neurologist.  She is unable to get any live vaccines due to her medications for myasthenia gravis.  She has her vaccination record with her today.  We reviewed this.  Per CDC guidelines she can receive an inactivated polio vaccine today as polio has been found in [place] in the past year.  She also needs an epi-pen refill.  Past Medical History:  Diagnosis Date   Acute respiratory failure (HCC)    With hypoxia and hypercapnea   Allergic rhinitis    Allergy    Asthma    Autoimmune disease ([place])    B12 deficiency    Chiari I malformation (HCC)    Congestive heart failure (HCC)    Depression    Fibromyalgia    GERD (gastroesophageal reflux disease)    Heartburn    Hypertension    Pulmonary hypertension   Migraines    Multiple food allergies    Myasthenia gravis ([place])    Prediabetes    Pulmonary hypertension (HCC)    Right ventricular dysfunction    Sleep apnea    TMJ (dislocation of temporomandibular joint)      Social History   Tobacco Use   Smoking status: Never   Smokeless tobacco: Never  Vaping Use   Vaping Use: Never used  Substance Use Topics   Alcohol use: No   Drug use: No    Past Surgical History:  Procedure Laterality Date   CARDIAC CATHETERIZATION     05-2017   THYMECTOMY  2009   ULNAR COLLATERAL LIGAMENT REPAIR Left 12/05/2020    Family History  Problem Relation Age of Onset   Hypertension Mother    Depression Mother    Asthma Mother    Bipolar disorder Mother    Thyroid disease Mother    Anxiety disorder Mother    Sleep apnea Mother    Obesity Father     High blood pressure Father    Aortic aneurysm Father    Asthma Sister    Tremor Sister        essential   Glaucoma Maternal Grandmother    Heart attack Paternal Grandmother    Hypertension Paternal Grandmother    Heart failure Paternal Grandfather    Breast cancer Cousin     Allergies  Allergen Reactions   Aminoglycosides Other (See Comments)    Myasthenia Gravis alert Myasthenia Gravis alert    Amoxicillin Swelling   Beta Adrenergic Blockers Other (See Comments)    Myasthenia Gravis alert Myasthenia Gravis alert    Bioflavonoids Hives   Macrolides And Ketolides Other (See Comments)    Myasthenia Gravis alert Myasthenia Gravis alert    Magnesium Anaphylaxis and Other (See Comments)    Myasthenia Gravis alert Myasthenia Gravis alert    Mometasone Furo-Formoterol Fum Swelling    Patient tongue swells Patient tongue swells    Onabotulinumtoxina Other (See Comments)    Myasthenia Gravis alert Myasthenia Gravis alert    Orange Oil Anaphylaxis    Oranges/ orange juice 10/2020 led to anaphylactic reaction   Penicillamine Other (See Comments)  Myasthenia Gravis alert   Penicillins Swelling   Povidone-Iodine Hives   Procainamide Other (See Comments)    Myasthenia Gravis alert Myasthenia Gravis alert    Quinolones Other (See Comments)    Myasthenia Gravis alert Myasthenia Gravis alert    Relpax [Eletriptan] Swelling   Sumatriptan Anaphylaxis    Other Reaction: throat closed   Tubocurarine Other (See Comments)    Myasthenia Gravis alert Myasthenia Gravis alert    Botox [Botulinum Toxin Type A]    Doxycycline     Other reaction(s): Vomiting   Iodine Hives   Latex Hives   Metformin And Related    Quinine Derivatives Swelling   Saline     Metal sensation in mouth   Sodium Chloride    Tetracyclines & Related     Other reaction(s): Vomiting   Zithromax [Azithromycin] Swelling    Current Medications:   Current Outpatient Medications:    albuterol  (ACCUNEB) 1.25 MG/3ML nebulizer solution, Take 3 mLs (1.25 mg total) by nebulization every 6 (six) hours as needed for wheezing., Disp: 75 mL, Rfl: 12   albuterol (VENTOLIN HFA) 108 (90 Base) MCG/ACT inhaler, INHALE 2 PUFFS INTO THE LUNGS EVERY 6 HOURS AS NEEDED FOR WHEEZING OR SHORTNESS OF BREATH, Disp: 18 g, Rfl: 2   candesartan (ATACAND) 32 MG tablet, Take by mouth., Disp: , Rfl:    empagliflozin (JARDIANCE) 10 MG TABS tablet, Take 1 tablet by mouth daily., Disp: , Rfl:    ibuprofen (ADVIL,MOTRIN) 800 MG tablet, , Disp: , Rfl:    Insulin Pen Needle 32G X 4 MM MISC, 1 each by Does not apply route once a week., Disp: 30 each, Rfl: 0   levocetirizine (XYZAL) 5 MG tablet, TAKE 1 TABLET(5 MG) BY MOUTH EVERY EVENING, Disp: 90 tablet, Rfl: 3   lidocaine (LIDODERM) 5 %, Place 1 patch onto the skin daily. Remove & Discard patch within 12 hours or as directed by MD, Disp: 30 patch, Rfl: 0   meloxicam (MOBIC) 15 MG tablet, Take 1 tablet (15 mg total) by mouth daily as needed for pain. Take with food. Do not take with other NSAIDs., Disp: 90 tablet, Rfl: 0   montelukast (SINGULAIR) 10 MG tablet, TAKE 1 TABLET(10 MG) BY MOUTH AT BEDTIME, Disp: 90 tablet, Rfl: 3   mycophenolate (CELLCEPT) 250 MG capsule, Twice daily, Disp: , Rfl:    mycophenolate (CELLCEPT) 500 MG tablet, Take by mouth. 2 twice daily, Disp: , Rfl:    ondansetron (ZOFRAN-ODT) 4 MG disintegrating tablet, Take 1 tablet (4 mg total) by mouth every 8 (eight) hours as needed for nausea or vomiting., Disp: 20 tablet, Rfl: 0   Rimegepant Sulfate (NURTEC) 75 MG TBDP, Take by mouth., Disp: , Rfl:    riTUXimab-abbs (TRUXIMA IV), Inject into the vein., Disp: , Rfl:    verapamil (VERELAN PM) 240 MG 24 hr capsule, Take 240 mg by mouth at bedtime., Disp: , Rfl:    zonisamide (ZONEGRAN) 100 MG capsule, Take 1 capsule (100 mg total) by mouth daily. (Patient taking differently: Take 200 mg by mouth daily.), Disp: , Rfl:    EPINEPHrine 0.3 mg/0.3 mL IJ SOAJ  injection, Inject 0.3 mg into the muscle as needed for anaphylaxis., Disp: 1 each, Rfl: 2   Review of Systems:   ROS Negative unless otherwise specified per HPI.  Vitals:   Vitals:   04/24/22 0943  BP: 122/80  Pulse: 92  Temp: 98 F (36.7 C)  TempSrc: Temporal  SpO2: 98%  Weight: 247 lb  6.1 oz (112.2 kg)  Height: 5' 7.25" (1.708 m)     Body mass index is 38.46 kg/m.  Physical Exam:   Physical Exam Vitals and nursing note reviewed.  Constitutional:      General: She is not in acute distress.    Appearance: She is well-developed. She is not ill-appearing or toxic-appearing.  Cardiovascular:     Rate and Rhythm: Normal rate and regular rhythm.     Pulses: Normal pulses.     Heart sounds: Normal heart sounds, S1 normal and S2 normal.  Pulmonary:     Effort: Pulmonary effort is normal.     Breath sounds: Normal breath sounds.  Skin:    General: Skin is warm and dry.  Neurological:     Mental Status: She is alert.     GCS: GCS eye subscore is 4. GCS verbal subscore is 5. GCS motor subscore is 6.  Psychiatric:        Speech: Speech normal.        Behavior: Behavior normal. Behavior is cooperative.     Assessment and Plan:   Travel advice encounter Reviewed CDC current travel guidelines for [place] Continue to closely monitor as travel dates nears in case there are any updates She will pursue COVID and Typhoid vaccine closer to travel date We updated polio vaccine today  Need for polio vaccination Completed today  Allergy, subsequent encounter I have sent in epi-pen refill   Jarold Motto, [name]

## 2022-04-24 NOTE — Patient Instructions (Addendum)
It was great to see you!  Health department for Typhoid  Inactive Polio booster today  I have sent in the Epi-Pen for you  Take care,  Inda Coke [name]

## 2022-04-26 DIAGNOSIS — M722 Plantar fascial fibromatosis: Secondary | ICD-10-CM | POA: Diagnosis not present

## 2022-04-30 DIAGNOSIS — M722 Plantar fascial fibromatosis: Secondary | ICD-10-CM | POA: Diagnosis not present

## 2022-04-30 DIAGNOSIS — G7000 Myasthenia gravis without (acute) exacerbation: Secondary | ICD-10-CM | POA: Diagnosis not present

## 2022-04-30 DIAGNOSIS — E559 Vitamin D deficiency, unspecified: Secondary | ICD-10-CM | POA: Diagnosis not present

## 2022-04-30 DIAGNOSIS — I519 Heart disease, unspecified: Secondary | ICD-10-CM | POA: Diagnosis not present

## 2022-05-16 DIAGNOSIS — G7000 Myasthenia gravis without (acute) exacerbation: Secondary | ICD-10-CM | POA: Diagnosis not present

## 2022-05-16 DIAGNOSIS — J9611 Chronic respiratory failure with hypoxia: Secondary | ICD-10-CM | POA: Diagnosis not present

## 2022-05-16 DIAGNOSIS — F5104 Psychophysiologic insomnia: Secondary | ICD-10-CM | POA: Diagnosis not present

## 2022-05-16 DIAGNOSIS — J9612 Chronic respiratory failure with hypercapnia: Secondary | ICD-10-CM | POA: Diagnosis not present

## 2022-05-16 DIAGNOSIS — I519 Heart disease, unspecified: Secondary | ICD-10-CM | POA: Diagnosis not present

## 2022-05-19 DIAGNOSIS — J9610 Chronic respiratory failure, unspecified whether with hypoxia or hypercapnia: Secondary | ICD-10-CM | POA: Diagnosis not present

## 2022-05-19 DIAGNOSIS — J449 Chronic obstructive pulmonary disease, unspecified: Secondary | ICD-10-CM | POA: Diagnosis not present

## 2022-05-19 DIAGNOSIS — G7000 Myasthenia gravis without (acute) exacerbation: Secondary | ICD-10-CM | POA: Diagnosis not present

## 2022-05-23 DIAGNOSIS — M722 Plantar fascial fibromatosis: Secondary | ICD-10-CM | POA: Diagnosis not present

## 2022-05-23 DIAGNOSIS — R7303 Prediabetes: Secondary | ICD-10-CM | POA: Diagnosis not present

## 2022-05-25 DIAGNOSIS — G7000 Myasthenia gravis without (acute) exacerbation: Secondary | ICD-10-CM | POA: Diagnosis not present

## 2022-05-29 ENCOUNTER — Encounter: Payer: Self-pay | Admitting: Physician Assistant

## 2022-05-29 ENCOUNTER — Ambulatory Visit (INDEPENDENT_AMBULATORY_CARE_PROVIDER_SITE_OTHER): Payer: BC Managed Care – PPO | Admitting: Physician Assistant

## 2022-05-29 VITALS — BP 138/80 | HR 103 | Temp 97.8°F | Ht 67.25 in | Wt 251.5 lb

## 2022-05-29 DIAGNOSIS — R051 Acute cough: Secondary | ICD-10-CM

## 2022-05-29 MED ORDER — BENZONATATE 200 MG PO CAPS: 200.0000 mg | ORAL_CAPSULE | Freq: Two times a day (BID) | ORAL | 0 refills | Status: DC | PRN

## 2022-05-29 MED ORDER — CEFDINIR 300 MG PO CAPS: 300.0000 mg | ORAL_CAPSULE | Freq: Two times a day (BID) | ORAL | 0 refills | Status: DC

## 2022-05-29 NOTE — Progress Notes (Signed)
Sandy Baker is a 36 y.o. female here for a new problem.  History of Present Illness:   Chief Complaint  Patient presents with   Cough    Pt c/o cough and low grade fever 99.5 & 99.7 on Wed then fever broke, coughing and expectorating yellow/green, also having yellow/green nasal drainage. Cough worse at night. COVID test x 3 Negative.    Cough    Cough Patient has concerns of cough and low grade fever 99.5 & 99.7 on Wed then fever broke, coughing and expectorating yellow/green, also having yellow/green nasal drainage. Cough worse at night. COVID test x 3 negative   Took a probiotic from [employer], Elderberry, Dayquil and Nyquil, honey, nasal rinses Chest tightness and rib pain with the cough.  Denies: severe SOB, n/v/d, severe body aches, stiffness of neck   Past Medical History:  Diagnosis Date   Acute respiratory failure (HCC)    With hypoxia and hypercapnea   Allergic rhinitis    Allergy    Asthma    Autoimmune disease ([place])    B12 deficiency    Chiari I malformation (HCC)    Congestive heart failure (HCC)    Depression    Fibromyalgia    GERD (gastroesophageal reflux disease)    Heartburn    Hypertension    Pulmonary hypertension   Migraines    Multiple food allergies    Myasthenia gravis ([place])    Prediabetes    Pulmonary hypertension (HCC)    Right ventricular dysfunction    Sleep apnea    TMJ (dislocation of temporomandibular joint)      Social History   Tobacco Use   Smoking status: Never   Smokeless tobacco: Never  Vaping Use   Vaping Use: Never used  Substance Use Topics   Alcohol use: No   Drug use: No    Past Surgical History:  Procedure Laterality Date   CARDIAC CATHETERIZATION     05-2017   THYMECTOMY  2009   ULNAR COLLATERAL LIGAMENT REPAIR Left 12/05/2020    Family History  Problem Relation Age of Onset   Hypertension Mother    Depression Mother    Asthma Mother    Bipolar disorder Mother    Thyroid disease Mother    Anxiety  disorder Mother    Sleep apnea Mother    Obesity Father    High blood pressure Father    Aortic aneurysm Father    Asthma Sister    Tremor Sister        essential   Glaucoma Maternal Grandmother    Heart attack Paternal Grandmother    Hypertension Paternal Grandmother    Heart failure Paternal Grandfather    Breast cancer Cousin     Allergies  Allergen Reactions   Aminoglycosides Other (See Comments)    Myasthenia Gravis alert Myasthenia Gravis alert    Amoxicillin Swelling   Beta Adrenergic Blockers Other (See Comments)    Myasthenia Gravis alert Myasthenia Gravis alert    Bioflavonoids Hives   Macrolides And Ketolides Other (See Comments)    Myasthenia Gravis alert Myasthenia Gravis alert    Magnesium Anaphylaxis and Other (See Comments)    Myasthenia Gravis alert Myasthenia Gravis alert    Mometasone Furo-Formoterol Fum Swelling    Patient tongue swells Patient tongue swells    Onabotulinumtoxina Other (See Comments)    Myasthenia Gravis alert Myasthenia Gravis alert    Orange Oil Anaphylaxis    Oranges/ orange juice 10/2020 led to anaphylactic reaction  Penicillamine Other (See Comments)    Myasthenia Gravis alert   Penicillins Swelling   Povidone-Iodine Hives   Procainamide Other (See Comments)    Myasthenia Gravis alert Myasthenia Gravis alert    Quinolones Other (See Comments)    Myasthenia Gravis alert Myasthenia Gravis alert    Relpax [Eletriptan] Swelling   Sumatriptan Anaphylaxis    Other Reaction: throat closed   Tubocurarine Other (See Comments)    Myasthenia Gravis alert Myasthenia Gravis alert    Botox [Botulinum Toxin Type A]    Doxycycline     Other reaction(s): Vomiting   Iodine Hives   Latex Hives   Metformin And Related    Quinine Derivatives Swelling   Saline     Metal sensation in mouth   Sodium Chloride    Tetracyclines & Related     Other reaction(s): Vomiting   Zithromax [Azithromycin] Swelling    Current  Medications:   Current Outpatient Medications:    albuterol (ACCUNEB) 1.25 MG/3ML nebulizer solution, Take 3 mLs (1.25 mg total) by nebulization every 6 (six) hours as needed for wheezing., Disp: 75 mL, Rfl: 12   albuterol (VENTOLIN HFA) 108 (90 Base) MCG/ACT inhaler, INHALE 2 PUFFS INTO THE LUNGS EVERY 6 HOURS AS NEEDED FOR WHEEZING OR SHORTNESS OF BREATH, Disp: 18 g, Rfl: 2   candesartan (ATACAND) 32 MG tablet, Take by mouth., Disp: , Rfl:    empagliflozin (JARDIANCE) 10 MG TABS tablet, Take 1 tablet by mouth daily., Disp: , Rfl:    EPINEPHrine 0.3 mg/0.3 mL IJ SOAJ injection, Inject 0.3 mg into the muscle as needed for anaphylaxis., Disp: 1 each, Rfl: 2   ibuprofen (ADVIL,MOTRIN) 800 MG tablet, , Disp: , Rfl:    Insulin Pen Needle 32G X 4 MM MISC, 1 each by Does not apply route once a week., Disp: 30 each, Rfl: 0   levocetirizine (XYZAL) 5 MG tablet, TAKE 1 TABLET(5 MG) BY MOUTH EVERY EVENING, Disp: 90 tablet, Rfl: 3   lidocaine (LIDODERM) 5 %, Place 1 patch onto the skin daily. Remove & Discard patch within 12 hours or as directed by MD, Disp: 30 patch, Rfl: 0   meloxicam (MOBIC) 15 MG tablet, Take 1 tablet (15 mg total) by mouth daily as needed for pain. Take with food. Do not take with other NSAIDs., Disp: 90 tablet, Rfl: 0   montelukast (SINGULAIR) 10 MG tablet, TAKE 1 TABLET(10 MG) BY MOUTH AT BEDTIME, Disp: 90 tablet, Rfl: 3   mycophenolate (CELLCEPT) 250 MG capsule, Twice daily, Disp: , Rfl:    mycophenolate (CELLCEPT) 500 MG tablet, Take by mouth. 2 twice daily, Disp: , Rfl:    ondansetron (ZOFRAN-ODT) 4 MG disintegrating tablet, Take 1 tablet (4 mg total) by mouth every 8 (eight) hours as needed for nausea or vomiting., Disp: 20 tablet, Rfl: 0   Rimegepant Sulfate (NURTEC) 75 MG TBDP, Take by mouth., Disp: , Rfl:    riTUXimab-abbs (TRUXIMA IV), Inject into the vein., Disp: , Rfl:    verapamil (VERELAN PM) 240 MG 24 hr capsule, Take 480 mg by mouth at bedtime., Disp: , Rfl:     zonisamide (ZONEGRAN) 100 MG capsule, Take 1 capsule (100 mg total) by mouth daily. (Patient taking differently: Take 200 mg by mouth daily.), Disp: , Rfl:    Vitamin D, Ergocalciferol, (DRISDOL) 1.25 MG (50000 UNIT) CAPS capsule, Take 50,000 Units by mouth once a week. (Patient not taking: Reported on 05/29/2022), Disp: , Rfl:    Review of Systems:   Review  of Systems  Respiratory:  Positive for cough.    Negative unless otherwise specified per HPI.  Vitals:   Vitals:   05/29/22 1108  BP: 138/80  Pulse: (!) 103  Temp: 97.8 F (36.6 C)  TempSrc: Temporal  SpO2: 96%  Weight: 251 lb 8 oz (114.1 kg)  Height: 5' 7.25" (1.708 m)     Body mass index is 39.1 kg/m.  Physical Exam:   Physical Exam Vitals and nursing note reviewed.  Constitutional:      General: She is not in acute distress.    Appearance: She is well-developed. She is not ill-appearing or toxic-appearing.  Cardiovascular:     Rate and Rhythm: Normal rate and regular rhythm.     Pulses: Normal pulses.     Heart sounds: Normal heart sounds, S1 normal and S2 normal.  Pulmonary:     Effort: Pulmonary effort is normal.     Breath sounds: Normal breath sounds.  Skin:    General: Skin is warm and dry.  Neurological:     Mental Status: She is alert.     GCS: GCS eye subscore is 4. GCS verbal subscore is 5. GCS motor subscore is 6.  Psychiatric:        Speech: Speech normal.        Behavior: Behavior normal. Behavior is cooperative.     Assessment and Plan:   Acute cough No red flags on exam.  Will initiate omnicef for sinusitis per orders.  She has multiple drug allergies but per chart review has had rocephin in the past and tolerated well. Discussed taking medications as prescribed. Reviewed return precautions including worsening fever, SOB, worsening cough or other concerns. Push fluids and rest. I recommend that patient follow-up if symptoms worsen or persist despite treatment x 7-10 days, sooner if  needed.   Inda Coke, [name]

## 2022-05-29 NOTE — Telephone Encounter (Signed)
Called [employer] and spoke to [place] T, told her calling about how to get and Exception form for a medication Tessalon Pearles for the pt. [name] said there is no form the provider will have to call and answer the questions. Told her okay.

## 2022-05-29 NOTE — Telephone Encounter (Signed)
Aldona Bar, please call [employer] , [employer] Rx benefit at [phone] to do a peer to peer for Tessalon capsules to be approved for pt to take. Insurance ID # [id number]

## 2022-05-29 NOTE — Telephone Encounter (Signed)
[employer] called regarding Benzonatate capsules saying it is not on patients formulary. Asked if there was something else pt could take. He said he could not recommend anything. Told him okay pt will have to use OTC medication I will call her.

## 2022-05-29 NOTE — Telephone Encounter (Signed)
Called and spoke to pt told her I am calling about your My chart message you sent. Told her I am sorry you felt that way, but normally we do not do exception forms and they can sometimes take up to 2 weeks to get a response, your insurance could not recommend anything else for you to take on your formulary. Also Sandy Baker did not have anything else to offer for you to take and I did not realize you could not take Delsym due to your condition, sorry you felt I was questioning you, but I was not, you  know your medical issues best. I did call insurance back and they said there was no form Sandy Baker will have to call and do a peer to peer for medication. Told her she has left for the day and will call tomorrow. Pt verbalized understanding and said she looked up medication on [facility] and can get it for $5.00 at [facility] so she will get it there. Told her okay, sorry again. Hope you feel better soon.

## 2022-05-29 NOTE — Telephone Encounter (Signed)
Pt called regarding call from insurance company, saying we did not want to fill out exception form. Told her Aldona Bar said she can use OTC medication does not have any other recommendation. Asked pt if she can use Delsym cough syrup? Pt said it does not work, and she can not take OTC meds due to her Myasthenia Gravis. Told her we can try to fill out exception form but it can take up 2 weeks. Pt said I was told it would take 72 hours. Told her okay I will contact insurance.

## 2022-05-31 DIAGNOSIS — G7000 Myasthenia gravis without (acute) exacerbation: Secondary | ICD-10-CM | POA: Diagnosis not present

## 2022-05-31 DIAGNOSIS — I519 Heart disease, unspecified: Secondary | ICD-10-CM | POA: Diagnosis not present

## 2022-05-31 DIAGNOSIS — E559 Vitamin D deficiency, unspecified: Secondary | ICD-10-CM | POA: Diagnosis not present

## 2022-05-31 DIAGNOSIS — R7303 Prediabetes: Secondary | ICD-10-CM | POA: Diagnosis not present

## 2022-06-06 DIAGNOSIS — I10 Essential (primary) hypertension: Secondary | ICD-10-CM | POA: Diagnosis not present

## 2022-06-06 DIAGNOSIS — G7000 Myasthenia gravis without (acute) exacerbation: Secondary | ICD-10-CM | POA: Diagnosis not present

## 2022-06-06 DIAGNOSIS — I519 Heart disease, unspecified: Secondary | ICD-10-CM | POA: Diagnosis not present

## 2022-06-06 DIAGNOSIS — I272 Pulmonary hypertension, unspecified: Secondary | ICD-10-CM | POA: Diagnosis not present

## 2022-06-06 DIAGNOSIS — R7303 Prediabetes: Secondary | ICD-10-CM | POA: Diagnosis not present

## 2022-06-17 DIAGNOSIS — G7000 Myasthenia gravis without (acute) exacerbation: Secondary | ICD-10-CM | POA: Diagnosis not present

## 2022-06-17 DIAGNOSIS — J449 Chronic obstructive pulmonary disease, unspecified: Secondary | ICD-10-CM | POA: Diagnosis not present

## 2022-06-17 DIAGNOSIS — J9610 Chronic respiratory failure, unspecified whether with hypoxia or hypercapnia: Secondary | ICD-10-CM | POA: Diagnosis not present

## 2022-06-29 ENCOUNTER — Other Ambulatory Visit: Payer: Self-pay | Admitting: Physician Assistant

## 2022-06-29 DIAGNOSIS — J301 Allergic rhinitis due to pollen: Secondary | ICD-10-CM

## 2022-06-29 DIAGNOSIS — J4520 Mild intermittent asthma, uncomplicated: Secondary | ICD-10-CM

## 2022-07-02 ENCOUNTER — Other Ambulatory Visit: Payer: Self-pay | Admitting: *Deleted

## 2022-07-02 DIAGNOSIS — M722 Plantar fascial fibromatosis: Secondary | ICD-10-CM | POA: Diagnosis not present

## 2022-07-02 MED ORDER — ALBUTEROL SULFATE HFA 108 (90 BASE) MCG/ACT IN AERS: 2.0000 | INHALATION_SPRAY | Freq: Four times a day (QID) | RESPIRATORY_TRACT | 2 refills | Status: DC | PRN

## 2022-07-03 ENCOUNTER — Telehealth: Payer: Self-pay | Admitting: Physician Assistant

## 2022-07-03 DIAGNOSIS — F5104 Psychophysiologic insomnia: Secondary | ICD-10-CM | POA: Diagnosis not present

## 2022-07-03 DIAGNOSIS — G7000 Myasthenia gravis without (acute) exacerbation: Secondary | ICD-10-CM | POA: Diagnosis not present

## 2022-07-03 DIAGNOSIS — G935 Compression of brain: Secondary | ICD-10-CM | POA: Diagnosis not present

## 2022-07-03 DIAGNOSIS — I10 Essential (primary) hypertension: Secondary | ICD-10-CM | POA: Diagnosis not present

## 2022-07-03 DIAGNOSIS — G43009 Migraine without aura, not intractable, without status migrainosus: Secondary | ICD-10-CM | POA: Diagnosis not present

## 2022-07-03 NOTE — Telephone Encounter (Signed)
Spoke to pt told her I sent refill yesterday for Levocetirizine to the pharmacy along with 3 other refills. Also I had to change your Albuterol inhaler to ProAir due to insurance. Pt verbalized understanding and will contact the pharmacy.

## 2022-07-03 NOTE — Telephone Encounter (Signed)
  Encourage patient to contact the pharmacy for refills or they can request refills through [place]:  Please schedule appointment if longer than 1 year  NEXT APPOINTMENT DATE:  MEDICATION:  levocetirizine (XYZAL) 5 MG tablet   Is the patient out of medication? YES  PHARMACY:  [id number] - [place], [place] Phone: [phone]  Fax: [phone]      Let patient know to contact pharmacy at the end of the day to make sure medication is ready.  Please notify patient to allow 48-72 hours to process

## 2022-07-04 ENCOUNTER — Telehealth: Payer: Self-pay | Admitting: *Deleted

## 2022-07-04 DIAGNOSIS — Z6839 Body mass index (BMI) 39.0-39.9, adult: Secondary | ICD-10-CM | POA: Diagnosis not present

## 2022-07-04 DIAGNOSIS — E8881 Metabolic syndrome: Secondary | ICD-10-CM | POA: Diagnosis not present

## 2022-07-04 DIAGNOSIS — R7303 Prediabetes: Secondary | ICD-10-CM | POA: Diagnosis not present

## 2022-07-04 NOTE — Telephone Encounter (Signed)
PA needed for Levocetirizine 5 mg tablet. PA done thru Covermymeds, awaiting determination. KEY: [id number]

## 2022-07-06 NOTE — Telephone Encounter (Signed)
Pharmacy Patient Advocate Encounter  Received notification from [facility] that the request for prior authorization for Levocetirizine 5MG  has been denied due to the requested service is not a covered benefit.       Please be advised we currently do not have a Pharmacist to review denials, therefore you will need to process appeals accordingly as needed. Thanks for your support at this time.   You may fax [phone], to appeal.

## 2022-07-09 NOTE — Telephone Encounter (Signed)
Spoke to pt told her PA for Levocetirizine  5 mg was denied by insurance due to equivalent to OTC drug. Pt verbalized understanding.

## 2022-07-11 DIAGNOSIS — I10 Essential (primary) hypertension: Secondary | ICD-10-CM | POA: Diagnosis not present

## 2022-07-11 DIAGNOSIS — G35 Multiple sclerosis: Secondary | ICD-10-CM | POA: Diagnosis not present

## 2022-07-11 DIAGNOSIS — R739 Hyperglycemia, unspecified: Secondary | ICD-10-CM | POA: Diagnosis not present

## 2022-07-18 DIAGNOSIS — J449 Chronic obstructive pulmonary disease, unspecified: Secondary | ICD-10-CM | POA: Diagnosis not present

## 2022-07-18 DIAGNOSIS — G7000 Myasthenia gravis without (acute) exacerbation: Secondary | ICD-10-CM | POA: Diagnosis not present

## 2022-07-18 DIAGNOSIS — J9610 Chronic respiratory failure, unspecified whether with hypoxia or hypercapnia: Secondary | ICD-10-CM | POA: Diagnosis not present

## 2022-07-19 DIAGNOSIS — E8881 Metabolic syndrome: Secondary | ICD-10-CM | POA: Diagnosis not present

## 2022-07-19 DIAGNOSIS — G7000 Myasthenia gravis without (acute) exacerbation: Secondary | ICD-10-CM | POA: Diagnosis not present

## 2022-07-19 DIAGNOSIS — Z79899 Other long term (current) drug therapy: Secondary | ICD-10-CM | POA: Diagnosis not present

## 2022-08-06 ENCOUNTER — Ambulatory Visit
Admission: EM | Admit: 2022-08-06 | Discharge: 2022-08-06 | Disposition: A | Discharge status: Home / Self Care | Payer: BC Managed Care – PPO | Attending: Family Medicine | Admitting: Family Medicine

## 2022-08-06 DIAGNOSIS — T7840XA Allergy, unspecified, initial encounter: Secondary | ICD-10-CM

## 2022-08-06 MED ORDER — METHYLPREDNISOLONE 4 MG PO TBPK: ORAL_TABLET | ORAL | 0 refills | Status: DC

## 2022-08-06 NOTE — ED Provider Notes (Addendum)
[facility]    CSN: [id number] Arrival date & time: 08/06/22  1009      History   Chief Complaint Chief Complaint  Patient presents with   Allergic Reaction    I Accidentally ingested oranges and been having gums, burning tongue, itching nose, congestion, and a bad cough. - Entered by patient    HPI Sandy Baker is a 37 y.o. female.   Patient is here for an allergic reaction.  She is allergic to oranges, and drank something with orange juice in it about 3 days ago.  She started with symptoms after drinking this.  Her tongue started tingling, gums and throat were tingling.  She also had some coughing.  She drank water, gargled with water.  The next day she was around someone on the bus who was peeling an orange.  She felt sob on that day.  Since then her chest has been feeling tight.  She did 3 nebs, inhaler, took benadryl, nasal rinse.  Last neb was yesterday  She did have an epi pen but did not use it.           Past Medical History:  Diagnosis Date   Acute respiratory failure (HCC)    With hypoxia and hypercapnea   Allergic rhinitis    Allergy    Asthma    Autoimmune disease (HCC)    B12 deficiency    Chiari I malformation (HCC)    Congestive heart failure (HCC)    Depression    Fibromyalgia    GERD (gastroesophageal reflux disease)    Heartburn    Hypertension    Pulmonary hypertension   Migraines    Multiple food allergies    Myasthenia gravis (HCC)    Prediabetes    Pulmonary hypertension (HCC)    Right ventricular dysfunction    Sleep apnea    TMJ (dislocation of temporomandibular joint)     Patient Active Problem List   Diagnosis Date Noted   Class 3 severe obesity due to excess calories with body mass index (BMI) of 40.0 to 44.9 in adult [facility]) 02/23/2021   Prediabetes 02/09/2021   Vitamin D deficiency 02/09/2021   Complex regional pain syndrome of left upper extremity 12/05/2020   Tachycardia 01/20/2016   Chiari I  malformation (HCC) 12/30/2015   Dysarthria 12/26/2015   Migraine without aura and without status migrainosus, not intractable 12/21/2015   Obstructive sleep apnea syndrome 11/05/2014   GERD (gastroesophageal reflux disease) 09/12/2013   Asthma without status asthmaticus 09/09/2013   Fibromyalgia 09/09/2013   Allergic rhinitis 09/09/2013   Myasthenia gravis (HCC) 06/19/2011    Past Surgical History:  Procedure Laterality Date   CARDIAC CATHETERIZATION     05-2017   THYMECTOMY  2009   ULNAR COLLATERAL LIGAMENT REPAIR Left 12/05/2020    OB History     Gravida  0   Para      Term      Preterm      AB      Living         SAB      IAB      Ectopic      Multiple      Live Births               Home Medications    Prior to Admission medications   Medication Sig Start Date End Date Taking? Authorizing Provider  albuterol (PROAIR HFA) 108 (90 Base) MCG/ACT inhaler Inhale 2 puffs into the  lungs every 6 (six) hours as needed for wheezing or shortness of breath. 07/02/22   Jarold Motto, PA  albuterol (ACCUNEB) 1.25 MG/3ML nebulizer solution USE 1 VIAL VIA NEBULIZER EVERY 6 HOURS AS NEEDED FOR WHEEZING 07/02/22   Jarold Motto, PA  benzonatate (TESSALON) 200 MG capsule Take 1 capsule (200 mg total) by mouth 2 (two) times daily as needed for cough. 05/29/22   Jarold Motto, PA  candesartan (ATACAND) 32 MG tablet Take by mouth. 07/19/21 07/19/22  [provider]  cefdinir (OMNICEF) 300 MG capsule Take 1 capsule (300 mg total) by mouth 2 (two) times daily. 05/29/22   Jarold Motto, PA  empagliflozin (JARDIANCE) 10 MG TABS tablet Take 1 tablet by mouth daily. 01/24/22 01/24/23  [provider]  EPINEPHrine 0.3 mg/0.3 mL IJ SOAJ injection Inject 0.3 mg into the muscle as needed for anaphylaxis. 04/24/22   Jarold Motto, PA  ibuprofen (ADVIL,MOTRIN) 800 MG tablet  04/03/17   [provider]  Insulin Pen Needle 32G X 4 MM MISC 1 each by Does  not apply route once a week. 07/18/21   Helane Rima, DO  levocetirizine (XYZAL) 5 MG tablet TAKE 1 TABLET(5 MG) BY MOUTH EVERY EVENING 07/02/22   Jarold Motto, PA  lidocaine (LIDODERM) 5 % Place 1 patch onto the skin daily. Remove & Discard patch within 12 hours or as directed by MD 12/29/19   Janeece Agee, NP  meloxicam (MOBIC) 15 MG tablet Take 1 tablet (15 mg total) by mouth daily as needed for pain. Take with food. Do not take with other NSAIDs. 08/14/19   Collie Siad A, MD  montelukast (SINGULAIR) 10 MG tablet TAKE 1 TABLET(10 MG) BY MOUTH AT BEDTIME 07/02/22   Jarold Motto, PA  mycophenolate (CELLCEPT) 250 MG capsule Twice daily 08/19/14   [provider]  mycophenolate (CELLCEPT) 500 MG tablet Take by mouth. 2 twice daily    [provider]  ondansetron (ZOFRAN-ODT) 4 MG disintegrating tablet Take 1 tablet (4 mg total) by mouth every 8 (eight) hours as needed for nausea or vomiting. 07/18/21   Helane Rima, DO  riTUXimab-abbs (TRUXIMA IV) Inject into the vein.    [provider]  verapamil (VERELAN PM) 240 MG 24 hr capsule Take 480 mg by mouth at bedtime.    [provider]  Vitamin D, Ergocalciferol, (DRISDOL) 1.25 MG (50000 UNIT) CAPS capsule Take 50,000 Units by mouth once a week. Patient not taking: Reported on 05/29/2022 04/30/22   [provider]  zonisamide (ZONEGRAN) 100 MG capsule Take 1 capsule (100 mg total) by mouth daily. Patient taking differently: Take 200 mg by mouth daily. 10/09/16   Garnetta Buddy, PA    Family History Family History  Problem Relation Age of Onset   Hypertension Mother    Depression Mother    Asthma Mother    Bipolar disorder Mother    Thyroid disease Mother    Anxiety disorder Mother    Sleep apnea Mother    Obesity Father    High blood pressure Father    Aortic aneurysm Father    Asthma Sister    Tremor Sister        essential   Glaucoma Maternal Grandmother    Heart attack Paternal  Grandmother    Hypertension Paternal Grandmother    Heart failure Paternal Grandfather    Breast cancer Cousin     Social History Social History   Tobacco Use   Smoking status: Never   Smokeless tobacco: Never  Vaping Use   Vaping Use: Never used  Substance Use Topics   Alcohol use: No   Drug use: No     Allergies   Aminoglycosides, Amoxicillin, Beta adrenergic blockers, Bioflavonoids, Macrolides and ketolides, Magnesium, Mometasone furo-formoterol fum, Onabotulinumtoxina, Orange oil, Penicillamine, Penicillins, Povidone-iodine, Procainamide, Quinolones, Relpax [eletriptan], Sumatriptan, Tubocurarine, Botox [botulinum toxin type a], Doxycycline, Iodine, Latex, Metformin and related, Quinine derivatives, Saline, Sodium chloride, Tetracyclines & related, and Zithromax [azithromycin]   Review of Systems Review of Systems  Constitutional: Negative.   HENT:  Positive for congestion and sore throat. Negative for trouble swallowing.   Respiratory:  Positive for cough.   Cardiovascular: Negative.   Gastrointestinal: Negative.   Genitourinary: Negative.   Musculoskeletal: Negative.   Psychiatric/Behavioral: Negative.       Physical Exam Triage Vital Signs ED Triage Vitals  Enc Vitals Group     BP 08/06/22 1023 118/80     Pulse Rate 08/06/22 1023 95     Resp 08/06/22 1023 18     Temp 08/06/22 1023 98.5 F (36.9 C)     Temp Source 08/06/22 1023 Oral     SpO2 08/06/22 1023 96 %     Weight --      Height --      Head Circumference --      Peak Flow --      Pain Score 08/06/22 1028 0     Pain Loc --      Pain Edu? --      Excl. in GC? --    No data found.  Updated Vital Signs BP 118/80 (BP Location: Left Arm)   Pulse 95   Temp 98.5 F (36.9 C) (Oral)   Resp 18   LMP 07/28/2022 (Exact Date)   SpO2 96%   Visual Acuity Right Eye Distance:   Left Eye Distance:   Bilateral Distance:    Right Eye Near:   Left Eye Near:    Bilateral Near:     Physical  Exam Constitutional:      General: She is not in acute distress.    Appearance: Normal appearance. She is not ill-appearing, toxic-appearing or diaphoretic.  HENT:     Nose: Congestion present. No rhinorrhea.     Mouth/Throat:     Mouth: Mucous membranes are moist.     Pharynx: No oropharyngeal exudate or posterior oropharyngeal erythema.  Cardiovascular:     Rate and Rhythm: Normal rate and regular rhythm.  Pulmonary:     Effort: Pulmonary effort is normal.     Breath sounds: Normal breath sounds.  Musculoskeletal:     Cervical back: Normal range of motion and neck supple. No tenderness.  Lymphadenopathy:     Cervical: No cervical adenopathy.  Skin:    General: Skin is warm.  Neurological:     General: No focal deficit present.     Mental Status: She is alert.  Psychiatric:        Mood and Affect: Mood normal.      UC Treatments / Results  Labs (all labs ordered are listed, but only abnormal results are displayed) Labs Reviewed - No data to display  EKG   Radiology No results found.  Procedures Procedures (including critical care time)  Medications Ordered in UC Medications - No data to display  Initial Impression / Assessment and Plan / UC Course  I have reviewed the triage vital signs and the nursing notes.  Pertinent labs & imaging results that were available during my care  of the patient were reviewed by me and considered in my medical decision making (see chart for details).   Final Clinical Impressions(s) / UC Diagnoses   Final diagnoses:  Allergic reaction, initial encounter     Discharge Instructions      You were seen today for an allergic reaction.  I have sent out a steroid pack to the pharmacy.  You may continue the benadryl and inhalers as needed for symptoms.  If not improving or worsening then please return for re-evaluation.      ED Prescriptions     Medication Sig Dispense Auth. Provider   methylPREDNISolone (MEDROL DOSEPAK) 4  MG TBPK tablet Take as directed 1 each Jannifer Franklin, MD      PDMP not reviewed this encounter.   Jannifer Franklin, MD 08/06/22 1050    Jannifer Franklin, MD 08/06/22 1353

## 2022-08-06 NOTE — ED Triage Notes (Signed)
Pt reports she accidentally ingested oranges 3 days ago, reports she is allergic to oranges. Reports  burning tongue, itching nose, congestion, and a bad cough x 3 days. Denies any difficult talking, swallowing, breathing. Pt did not want to use her epi pen. Pt tried nebulizer.

## 2022-08-06 NOTE — Discharge Instructions (Signed)
You were seen today for an allergic reaction.  I have sent out a steroid pack to the pharmacy.  You may continue the benadryl and inhalers as needed for symptoms.  If not improving or worsening then please return for re-evaluation.

## 2022-08-08 DIAGNOSIS — H9203 Otalgia, bilateral: Secondary | ICD-10-CM | POA: Diagnosis not present

## 2022-08-08 DIAGNOSIS — H6993 Unspecified Eustachian tube disorder, bilateral: Secondary | ICD-10-CM | POA: Diagnosis not present

## 2022-08-08 DIAGNOSIS — Z0110 Encounter for examination of ears and hearing without abnormal findings: Secondary | ICD-10-CM | POA: Diagnosis not present

## 2022-08-08 DIAGNOSIS — M26609 Unspecified temporomandibular joint disorder, unspecified side: Secondary | ICD-10-CM | POA: Diagnosis not present

## 2022-08-14 DIAGNOSIS — R7303 Prediabetes: Secondary | ICD-10-CM | POA: Diagnosis not present

## 2022-08-14 DIAGNOSIS — I519 Heart disease, unspecified: Secondary | ICD-10-CM | POA: Diagnosis not present

## 2022-08-14 DIAGNOSIS — R7301 Impaired fasting glucose: Secondary | ICD-10-CM | POA: Diagnosis not present

## 2022-08-17 DIAGNOSIS — G7000 Myasthenia gravis without (acute) exacerbation: Secondary | ICD-10-CM | POA: Diagnosis not present

## 2022-08-17 DIAGNOSIS — J9610 Chronic respiratory failure, unspecified whether with hypoxia or hypercapnia: Secondary | ICD-10-CM | POA: Diagnosis not present

## 2022-08-17 DIAGNOSIS — J449 Chronic obstructive pulmonary disease, unspecified: Secondary | ICD-10-CM | POA: Diagnosis not present

## 2022-09-17 DIAGNOSIS — J449 Chronic obstructive pulmonary disease, unspecified: Secondary | ICD-10-CM | POA: Diagnosis not present

## 2022-09-17 DIAGNOSIS — J9610 Chronic respiratory failure, unspecified whether with hypoxia or hypercapnia: Secondary | ICD-10-CM | POA: Diagnosis not present

## 2022-09-17 DIAGNOSIS — G7000 Myasthenia gravis without (acute) exacerbation: Secondary | ICD-10-CM | POA: Diagnosis not present

## 2022-10-04 DIAGNOSIS — G4733 Obstructive sleep apnea (adult) (pediatric): Secondary | ICD-10-CM | POA: Diagnosis not present

## 2022-10-04 DIAGNOSIS — R7303 Prediabetes: Secondary | ICD-10-CM | POA: Diagnosis not present

## 2022-10-04 DIAGNOSIS — G7000 Myasthenia gravis without (acute) exacerbation: Secondary | ICD-10-CM | POA: Diagnosis not present

## 2022-10-04 DIAGNOSIS — I519 Heart disease, unspecified: Secondary | ICD-10-CM | POA: Diagnosis not present

## 2022-10-08 DIAGNOSIS — Z6839 Body mass index (BMI) 39.0-39.9, adult: Secondary | ICD-10-CM | POA: Diagnosis not present

## 2022-10-08 DIAGNOSIS — R7303 Prediabetes: Secondary | ICD-10-CM | POA: Diagnosis not present

## 2022-10-09 DIAGNOSIS — Z6839 Body mass index (BMI) 39.0-39.9, adult: Secondary | ICD-10-CM | POA: Diagnosis not present

## 2022-10-17 DIAGNOSIS — J449 Chronic obstructive pulmonary disease, unspecified: Secondary | ICD-10-CM | POA: Diagnosis not present

## 2022-10-17 DIAGNOSIS — J9610 Chronic respiratory failure, unspecified whether with hypoxia or hypercapnia: Secondary | ICD-10-CM | POA: Diagnosis not present

## 2022-10-17 DIAGNOSIS — G7000 Myasthenia gravis without (acute) exacerbation: Secondary | ICD-10-CM | POA: Diagnosis not present

## 2022-11-05 DIAGNOSIS — M722 Plantar fascial fibromatosis: Secondary | ICD-10-CM | POA: Diagnosis not present

## 2022-11-17 DIAGNOSIS — J449 Chronic obstructive pulmonary disease, unspecified: Secondary | ICD-10-CM | POA: Diagnosis not present

## 2022-11-17 DIAGNOSIS — J9610 Chronic respiratory failure, unspecified whether with hypoxia or hypercapnia: Secondary | ICD-10-CM | POA: Diagnosis not present

## 2022-11-17 DIAGNOSIS — G7000 Myasthenia gravis without (acute) exacerbation: Secondary | ICD-10-CM | POA: Diagnosis not present

## 2022-11-23 DIAGNOSIS — G7000 Myasthenia gravis without (acute) exacerbation: Secondary | ICD-10-CM | POA: Diagnosis not present

## 2022-11-28 DIAGNOSIS — M2012 Hallux valgus (acquired), left foot: Secondary | ICD-10-CM | POA: Diagnosis not present

## 2022-11-29 DIAGNOSIS — Z6841 Body Mass Index (BMI) 40.0 and over, adult: Secondary | ICD-10-CM | POA: Diagnosis not present

## 2022-11-29 DIAGNOSIS — M79672 Pain in left foot: Secondary | ICD-10-CM | POA: Diagnosis not present

## 2022-11-29 DIAGNOSIS — R7303 Prediabetes: Secondary | ICD-10-CM | POA: Diagnosis not present

## 2022-12-05 DIAGNOSIS — I10 Essential (primary) hypertension: Secondary | ICD-10-CM | POA: Diagnosis not present

## 2022-12-05 DIAGNOSIS — I519 Heart disease, unspecified: Secondary | ICD-10-CM | POA: Diagnosis not present

## 2022-12-05 DIAGNOSIS — Z09 Encounter for follow-up examination after completed treatment for conditions other than malignant neoplasm: Secondary | ICD-10-CM | POA: Diagnosis not present

## 2022-12-05 DIAGNOSIS — G7000 Myasthenia gravis without (acute) exacerbation: Secondary | ICD-10-CM | POA: Diagnosis not present

## 2022-12-05 DIAGNOSIS — Z79899 Other long term (current) drug therapy: Secondary | ICD-10-CM | POA: Diagnosis not present

## 2022-12-05 DIAGNOSIS — G4733 Obstructive sleep apnea (adult) (pediatric): Secondary | ICD-10-CM | POA: Diagnosis not present

## 2022-12-06 DIAGNOSIS — G7000 Myasthenia gravis without (acute) exacerbation: Secondary | ICD-10-CM | POA: Diagnosis not present

## 2022-12-06 DIAGNOSIS — R202 Paresthesia of skin: Secondary | ICD-10-CM | POA: Diagnosis not present

## 2022-12-06 DIAGNOSIS — I10 Essential (primary) hypertension: Secondary | ICD-10-CM | POA: Diagnosis not present

## 2022-12-06 DIAGNOSIS — R7303 Prediabetes: Secondary | ICD-10-CM | POA: Diagnosis not present

## 2022-12-06 DIAGNOSIS — M25572 Pain in left ankle and joints of left foot: Secondary | ICD-10-CM | POA: Diagnosis not present

## 2022-12-14 DIAGNOSIS — M722 Plantar fascial fibromatosis: Secondary | ICD-10-CM | POA: Diagnosis not present

## 2022-12-18 DIAGNOSIS — J9610 Chronic respiratory failure, unspecified whether with hypoxia or hypercapnia: Secondary | ICD-10-CM | POA: Diagnosis not present

## 2022-12-18 DIAGNOSIS — G7000 Myasthenia gravis without (acute) exacerbation: Secondary | ICD-10-CM | POA: Diagnosis not present

## 2022-12-18 DIAGNOSIS — J449 Chronic obstructive pulmonary disease, unspecified: Secondary | ICD-10-CM | POA: Diagnosis not present

## 2023-01-08 IMAGING — US US BREAST*R* LIMITED INC AXILLA
1 series · 1 of 1 positions shown · non-contrast
Comparison: Previous exam(s).

CLINICAL DATA: 35-year-old female for 1 year follow-up of RIGHT
breast mass.

EXAM:
ULTRASOUND OF THE RIGHT BREAST

[Series 1: us breast*right* limited inc axilla · 0.06mm/px · 1 of 1 slices shown]
[im 1/1]
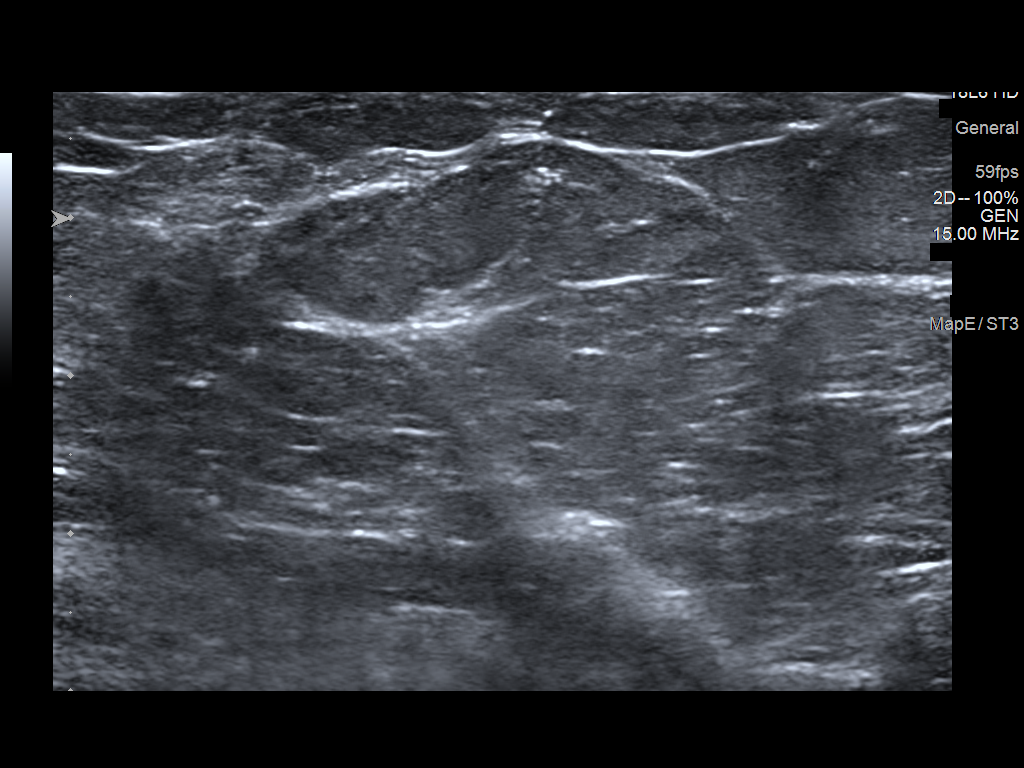

[1 of 1 positions shown; findings below may reference images not displayed]

FINDINGS: Targeted ultrasound is performed, showing no sonographic
abnormalities within the LOWER OUTER RIGHT breast. There has been
interval resolution of the hypoechoic mass at the 7 o'clock position
of the RIGHT breast 10 cm from the nipple.
IMPRESSION: Interval resolution of LOWER OUTER RIGHT breast mass since the prior
study.

RECOMMENDATION:
Bilateral screening mammogram at age 42.

I have discussed the findings and recommendations with the patient.
If applicable, a reminder letter will be sent to the patient
regarding the next appointment.

BI-RADS CATEGORY  1: Negative.

## 2023-01-09 DIAGNOSIS — M722 Plantar fascial fibromatosis: Secondary | ICD-10-CM | POA: Diagnosis not present

## 2023-01-10 DIAGNOSIS — G7000 Myasthenia gravis without (acute) exacerbation: Secondary | ICD-10-CM | POA: Diagnosis not present

## 2023-01-10 DIAGNOSIS — R7303 Prediabetes: Secondary | ICD-10-CM | POA: Diagnosis not present

## 2023-01-10 DIAGNOSIS — Z6841 Body Mass Index (BMI) 40.0 and over, adult: Secondary | ICD-10-CM | POA: Diagnosis not present

## 2023-01-17 DIAGNOSIS — J449 Chronic obstructive pulmonary disease, unspecified: Secondary | ICD-10-CM | POA: Diagnosis not present

## 2023-01-17 DIAGNOSIS — J9610 Chronic respiratory failure, unspecified whether with hypoxia or hypercapnia: Secondary | ICD-10-CM | POA: Diagnosis not present

## 2023-01-17 DIAGNOSIS — G7000 Myasthenia gravis without (acute) exacerbation: Secondary | ICD-10-CM | POA: Diagnosis not present

## 2023-01-22 ENCOUNTER — Ambulatory Visit: Payer: BC Managed Care – PPO | Admitting: Obstetrics & Gynecology

## 2023-01-22 ENCOUNTER — Encounter: Payer: Self-pay | Admitting: Obstetrics and Gynecology

## 2023-01-22 ENCOUNTER — Ambulatory Visit (INDEPENDENT_AMBULATORY_CARE_PROVIDER_SITE_OTHER): Payer: BC Managed Care – PPO | Admitting: Obstetrics and Gynecology

## 2023-01-22 VITALS — BP 110/70 | HR 67 | Ht 68.0 in | Wt 270.2 lb

## 2023-01-22 DIAGNOSIS — Z01419 Encounter for gynecological examination (general) (routine) without abnormal findings: Secondary | ICD-10-CM | POA: Diagnosis not present

## 2023-01-22 NOTE — Patient Instructions (Signed)

## 2023-01-22 NOTE — Progress Notes (Signed)
37 y.o. G0P0 female with myasthenia graves, pulmonary hypertension, FBMG, morbid obesity, prediabetes, multiple allergies here for annual exam. Single  Patient's last menstrual period was 01/10/2023. Period Cycle (Days): 28 Period Duration (Days): 4 Period Pattern: (!) Irregular Menstrual Flow: Moderate Menstrual Control: Tampon Menstrual Control Change Freq (Hours): 3 Dysmenorrhea: None  Abnormal bleeding: no Pelvic discharge or pain: no Breast mass, nipple discharge or skin changes : no Birth control: none Last PAP:     Component Value Date/Time   DIAGPAP  01/17/2022 0907    - Negative for intraepithelial lesion or malignancy (NILM)   DIAGPAP  01/04/2021 0957    - Negative for intraepithelial lesion or malignancy (NILM)   ADEQPAP  01/17/2022 0907    Satisfactory for evaluation; transformation zone component PRESENT.   ADEQPAP  01/04/2021 0957    Satisfactory for evaluation; transformation zone component PRESENT.   Last mammogram: 2021, clip placed Last colonoscopy: never Sexually active: no  Exercising: No, recently tore her plantar fascia working out, is non-weight bearing  GYN HISTORY: Left breast biopsy, 2021: fibroadenoma, PASH- no increased risk of malignancy  OB History  Gravida Para Term Preterm AB Living  0            SAB IAB Ectopic Multiple Live Births               Past Medical History:  Diagnosis Date   Acute respiratory failure (HCC)    With hypoxia and hypercapnea   Allergic rhinitis    Allergy    Asthma    Autoimmune disease (HCC)    B12 deficiency    Chiari I malformation (HCC)    Congestive heart failure (HCC)    Depression    Fibromyalgia    GERD (gastroesophageal reflux disease)    Heartburn    Hypertension    Pulmonary hypertension   Migraines    Multiple food allergies    Myasthenia gravis (HCC)    Prediabetes    Pulmonary hypertension (HCC)    Right ventricular dysfunction    Sleep apnea    TMJ (dislocation of  temporomandibular joint)     Past Surgical History:  Procedure Laterality Date   CARDIAC CATHETERIZATION     05-2017   THYMECTOMY  2009   ULNAR COLLATERAL LIGAMENT REPAIR Left 12/05/2020    Current Outpatient Medications on File Prior to Visit  Medication Sig Dispense Refill   albuterol (ACCUNEB) 1.25 MG/3ML nebulizer solution USE 1 VIAL VIA NEBULIZER EVERY 6 HOURS AS NEEDED FOR WHEEZING 75 mL 12   albuterol (PROAIR HFA) 108 (90 Base) MCG/ACT inhaler Inhale 2 puffs into the lungs every 6 (six) hours as needed for wheezing or shortness of breath. 1 each 2   candesartan (ATACAND) 32 MG tablet Take by mouth.     empagliflozin (JARDIANCE) 10 MG TABS tablet Take 1 tablet by mouth daily.     EPINEPHrine 0.3 mg/0.3 mL IJ SOAJ injection Inject 0.3 mg into the muscle as needed for anaphylaxis. 1 each 2   ibuprofen (ADVIL,MOTRIN) 800 MG tablet      levocetirizine (XYZAL) 5 MG tablet TAKE 1 TABLET(5 MG) BY MOUTH EVERY EVENING 90 tablet 3   lidocaine (LIDODERM) 5 % Place 1 patch onto the skin daily. Remove & Discard patch within 12 hours or as directed by MD 30 patch 0   meloxicam (MOBIC) 15 MG tablet Take 1 tablet (15 mg total) by mouth daily as needed for pain. Take with food. Do not take with other  NSAIDs. 90 tablet 0   montelukast (SINGULAIR) 10 MG tablet TAKE 1 TABLET(10 MG) BY MOUTH AT BEDTIME 90 tablet 3   mycophenolate (CELLCEPT) 250 MG capsule Twice daily     mycophenolate (CELLCEPT) 500 MG tablet Take by mouth. 2 twice daily     ondansetron (ZOFRAN-ODT) 4 MG disintegrating tablet Take 1 tablet (4 mg total) by mouth every 8 (eight) hours as needed for nausea or vomiting. 20 tablet 0   riTUXimab-abbs (TRUXIMA IV) Inject into the vein.     verapamil (VERELAN PM) 240 MG 24 hr capsule Take 480 mg by mouth at bedtime.     zonisamide (ZONEGRAN) 100 MG capsule Take 1 capsule (100 mg total) by mouth daily. (Patient taking differently: Take 200 mg by mouth daily.)     No current  facility-administered medications on file prior to visit.    Social History   Socioeconomic History   Marital status: Single    Spouse name: Not on file   Number of children: Not on file   Years of education: BA   Highest education level: Not on file  Occupational History   Occupation: [employer]    Occupation: [employer]   Occupation: Admin Assist  Tobacco Use   Smoking status: Never   Smokeless tobacco: Never  Vaping Use   Vaping status: Never Used  Substance and Sexual Activity   Alcohol use: No   Drug use: No   Sexual activity: Not Currently    Birth control/protection: Abstinence    Comment: 1st intercourse 37 yo-Fewer than 5 partners  Other Topics Concern   Not on file  Social History Narrative   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   No children   Single   Social Determinants of Health   Financial Resource Strain: Not on file  Food Insecurity: No Food Insecurity (07/04/2022)   Received [facility], [facility]   Hunger Vital Sign    Worried About Running Out of Food in the Last Year: Never true    Ran Out of Food in the Last Year: Never true  Transportation Needs: Unknown (07/04/2022)   Received from [facility], [facility]   [facility] - Transportation    In the past 12 months, has lack of transportation kept you from medical appointments or from getting medications?: No    Lack of Transportation (Non-Medical): Not on file  Physical Activity: Not on file  Stress: Not on file  Social Connections: Not on file  Intimate Partner Violence: Not on file    Family History  Problem Relation Age of Onset   Hypertension Mother    Depression Mother    Asthma Mother    Bipolar disorder Mother    Thyroid disease Mother    Anxiety disorder Mother    Sleep apnea Mother    Obesity Father    High blood pressure Father    Aortic aneurysm Father    Asthma Sister    Tremor  Sister        essential   Glaucoma Maternal Grandmother    Heart attack Paternal Grandmother    Hypertension Paternal Grandmother    Heart failure Paternal Grandfather    Breast cancer Cousin     Allergies  Allergen Reactions   Aminoglycosides Other (See Comments)    Myasthenia Gravis alert Myasthenia Gravis alert    Amoxicillin Swelling   Beta Adrenergic Blockers Other (See Comments)  Myasthenia Gravis alert  Myasthenia Gravis alert, Myasthenia Gravis alert   Bioflavonoids Hives   Macrolides And Ketolides Other (See Comments)    Myasthenia Gravis alert Myasthenia Gravis alert    Magnesium Anaphylaxis and Other (See Comments)    Myasthenia Gravis alert Myasthenia Gravis alert    Mometasone Furo-Formoterol Fum Swelling    Patient tongue swells Patient tongue swells    Onabotulinumtoxina Other (See Comments)    Myasthenia Gravis alert Myasthenia Gravis alert    Orange Oil Anaphylaxis    Oranges/ orange juice 10/2020 led to anaphylactic reaction   Penicillamine Other (See Comments)    Myasthenia Gravis alert   Penicillins Swelling   Povidone-Iodine Hives   Procainamide Other (See Comments)    Myasthenia Gravis alert Myasthenia Gravis alert    Quinolones Other (See Comments)    Myasthenia Gravis alert Myasthenia Gravis alert    Relpax [Eletriptan] Swelling   Sumatriptan Anaphylaxis    Other Reaction: throat closed   Tubocurarine Other (See Comments)    Myasthenia Gravis alert Myasthenia Gravis alert    Botox [Botulinum Toxin Type A]    Doxycycline     Other reaction(s): Vomiting   Iodine Hives   Latex Hives   Metformin And Related    Quinine Derivatives Swelling   Saline     Metal sensation in mouth   Sodium Chloride    Tetracyclines & Related     Other reaction(s): Vomiting   Zithromax [Azithromycin] Swelling      PE Today's Vitals   01/22/23 0837  BP: 110/70  Pulse: 67  SpO2: 100%  Weight: 270 lb 3.2 oz (122.6 kg)  Height: 5\' 8"  (1.727  m)   Body mass index is 41.08 kg/m.  Physical Exam Vitals reviewed. Exam conducted with a chaperone present.  Constitutional:      General: She is not in acute distress.    Appearance: Normal appearance.  HENT:     Head: Normocephalic and atraumatic.     Nose: Nose normal.  Eyes:     Extraocular Movements: Extraocular movements intact.     Conjunctiva/sclera: Conjunctivae normal.  Neck:     Thyroid: No thyroid mass, thyromegaly or thyroid tenderness.  Pulmonary:     Effort: Pulmonary effort is normal.  Chest:     Chest wall: No mass or tenderness.  Breasts:    Right: Normal. No swelling, mass, nipple discharge, skin change or tenderness.     Left: Normal. No swelling, mass, nipple discharge, skin change or tenderness.  Abdominal:     General: There is no distension.     Palpations: Abdomen is soft.     Tenderness: There is no abdominal tenderness.  Genitourinary:    General: Normal vulva.     Exam position: Lithotomy position.     Urethra: No prolapse.     Vagina: Normal. No vaginal discharge or bleeding.     Cervix: Normal. No lesion.     Uterus: Normal. Not enlarged and not tender.      Adnexa: Right adnexa normal and left adnexa normal.  Musculoskeletal:        General: Normal range of motion.     Cervical back: Normal range of motion.  Lymphadenopathy:     Upper Body:     Right upper body: No axillary adenopathy.     Left upper body: No axillary adenopathy.     Lower Body: No right inguinal adenopathy. No left inguinal adenopathy.  Skin:    General: Skin is warm  and dry.  Neurological:     General: No focal deficit present.     Mental Status: She is alert.  Psychiatric:        Mood and Affect: Mood normal.        Behavior: Behavior normal.       Assessment and Plan:        Well woman exam with routine gynecological exam Assessment & Plan: Cervical cancer screening performed according to ASCCP guidelines. Labs and immunizations with her  primary Encouraged safe sexual practices as indicated Encouraged healthy lifestyle practices with diet and exercise e     Rosalyn Gess, MD

## 2023-01-22 NOTE — Assessment & Plan Note (Signed)
Cervical cancer screening performed according to ASCCP guidelines. Labs and immunizations with her primary Encouraged safe sexual practices as indicated Encouraged healthy lifestyle practices with diet and exercise e

## 2023-01-23 ENCOUNTER — Ambulatory Visit: Payer: BC Managed Care – PPO | Admitting: Obstetrics and Gynecology

## 2023-02-06 DIAGNOSIS — M722 Plantar fascial fibromatosis: Secondary | ICD-10-CM | POA: Diagnosis not present

## 2023-02-07 DIAGNOSIS — M722 Plantar fascial fibromatosis: Secondary | ICD-10-CM | POA: Diagnosis not present

## 2023-02-07 DIAGNOSIS — R262 Difficulty in walking, not elsewhere classified: Secondary | ICD-10-CM | POA: Diagnosis not present

## 2023-02-07 DIAGNOSIS — M6281 Muscle weakness (generalized): Secondary | ICD-10-CM | POA: Diagnosis not present

## 2023-02-07 DIAGNOSIS — R7303 Prediabetes: Secondary | ICD-10-CM | POA: Diagnosis not present

## 2023-02-12 DIAGNOSIS — M722 Plantar fascial fibromatosis: Secondary | ICD-10-CM | POA: Diagnosis not present

## 2023-02-12 DIAGNOSIS — R262 Difficulty in walking, not elsewhere classified: Secondary | ICD-10-CM | POA: Diagnosis not present

## 2023-02-12 DIAGNOSIS — M6281 Muscle weakness (generalized): Secondary | ICD-10-CM | POA: Diagnosis not present

## 2023-02-13 DIAGNOSIS — G7000 Myasthenia gravis without (acute) exacerbation: Secondary | ICD-10-CM | POA: Diagnosis not present

## 2023-02-13 DIAGNOSIS — I519 Heart disease, unspecified: Secondary | ICD-10-CM | POA: Diagnosis not present

## 2023-02-13 DIAGNOSIS — E66813 Obesity, class 3: Secondary | ICD-10-CM | POA: Diagnosis not present

## 2023-02-13 DIAGNOSIS — I10 Essential (primary) hypertension: Secondary | ICD-10-CM | POA: Diagnosis not present

## 2023-02-13 DIAGNOSIS — Z6841 Body Mass Index (BMI) 40.0 and over, adult: Secondary | ICD-10-CM | POA: Diagnosis not present

## 2023-02-14 DIAGNOSIS — M722 Plantar fascial fibromatosis: Secondary | ICD-10-CM | POA: Diagnosis not present

## 2023-02-14 DIAGNOSIS — M6281 Muscle weakness (generalized): Secondary | ICD-10-CM | POA: Diagnosis not present

## 2023-02-14 DIAGNOSIS — R262 Difficulty in walking, not elsewhere classified: Secondary | ICD-10-CM | POA: Diagnosis not present

## 2023-02-17 DIAGNOSIS — G7000 Myasthenia gravis without (acute) exacerbation: Secondary | ICD-10-CM | POA: Diagnosis not present

## 2023-02-17 DIAGNOSIS — J449 Chronic obstructive pulmonary disease, unspecified: Secondary | ICD-10-CM | POA: Diagnosis not present

## 2023-02-17 DIAGNOSIS — J9610 Chronic respiratory failure, unspecified whether with hypoxia or hypercapnia: Secondary | ICD-10-CM | POA: Diagnosis not present

## 2023-02-19 DIAGNOSIS — M6281 Muscle weakness (generalized): Secondary | ICD-10-CM | POA: Diagnosis not present

## 2023-02-19 DIAGNOSIS — R262 Difficulty in walking, not elsewhere classified: Secondary | ICD-10-CM | POA: Diagnosis not present

## 2023-02-19 DIAGNOSIS — M722 Plantar fascial fibromatosis: Secondary | ICD-10-CM | POA: Diagnosis not present

## 2023-02-22 DIAGNOSIS — I519 Heart disease, unspecified: Secondary | ICD-10-CM | POA: Diagnosis not present

## 2023-02-26 DIAGNOSIS — M722 Plantar fascial fibromatosis: Secondary | ICD-10-CM | POA: Diagnosis not present

## 2023-02-26 DIAGNOSIS — R262 Difficulty in walking, not elsewhere classified: Secondary | ICD-10-CM | POA: Diagnosis not present

## 2023-02-26 DIAGNOSIS — M6281 Muscle weakness (generalized): Secondary | ICD-10-CM | POA: Diagnosis not present

## 2023-03-04 DIAGNOSIS — M722 Plantar fascial fibromatosis: Secondary | ICD-10-CM | POA: Diagnosis not present

## 2023-03-04 DIAGNOSIS — M6281 Muscle weakness (generalized): Secondary | ICD-10-CM | POA: Diagnosis not present

## 2023-03-04 DIAGNOSIS — R262 Difficulty in walking, not elsewhere classified: Secondary | ICD-10-CM | POA: Diagnosis not present

## 2023-03-06 DIAGNOSIS — R262 Difficulty in walking, not elsewhere classified: Secondary | ICD-10-CM | POA: Diagnosis not present

## 2023-03-06 DIAGNOSIS — M722 Plantar fascial fibromatosis: Secondary | ICD-10-CM | POA: Diagnosis not present

## 2023-03-06 DIAGNOSIS — M6281 Muscle weakness (generalized): Secondary | ICD-10-CM | POA: Diagnosis not present

## 2023-03-19 DIAGNOSIS — G7000 Myasthenia gravis without (acute) exacerbation: Secondary | ICD-10-CM | POA: Diagnosis not present

## 2023-03-19 DIAGNOSIS — J449 Chronic obstructive pulmonary disease, unspecified: Secondary | ICD-10-CM | POA: Diagnosis not present

## 2023-03-19 DIAGNOSIS — J9610 Chronic respiratory failure, unspecified whether with hypoxia or hypercapnia: Secondary | ICD-10-CM | POA: Diagnosis not present

## 2023-03-20 DIAGNOSIS — M6281 Muscle weakness (generalized): Secondary | ICD-10-CM | POA: Diagnosis not present

## 2023-04-17 DIAGNOSIS — R7303 Prediabetes: Secondary | ICD-10-CM | POA: Diagnosis not present

## 2023-04-17 DIAGNOSIS — E66813 Obesity, class 3: Secondary | ICD-10-CM | POA: Diagnosis not present

## 2023-04-17 DIAGNOSIS — Z6841 Body Mass Index (BMI) 40.0 and over, adult: Secondary | ICD-10-CM | POA: Diagnosis not present

## 2023-04-18 IMAGING — DX DG CHEST 2V
2 series · 2 of 2 positions shown · non-contrast
Comparison: 02/26/2017

CLINICAL DATA: URI, immune suppression

EXAM:
CHEST - 2 VIEW

[chest pa]
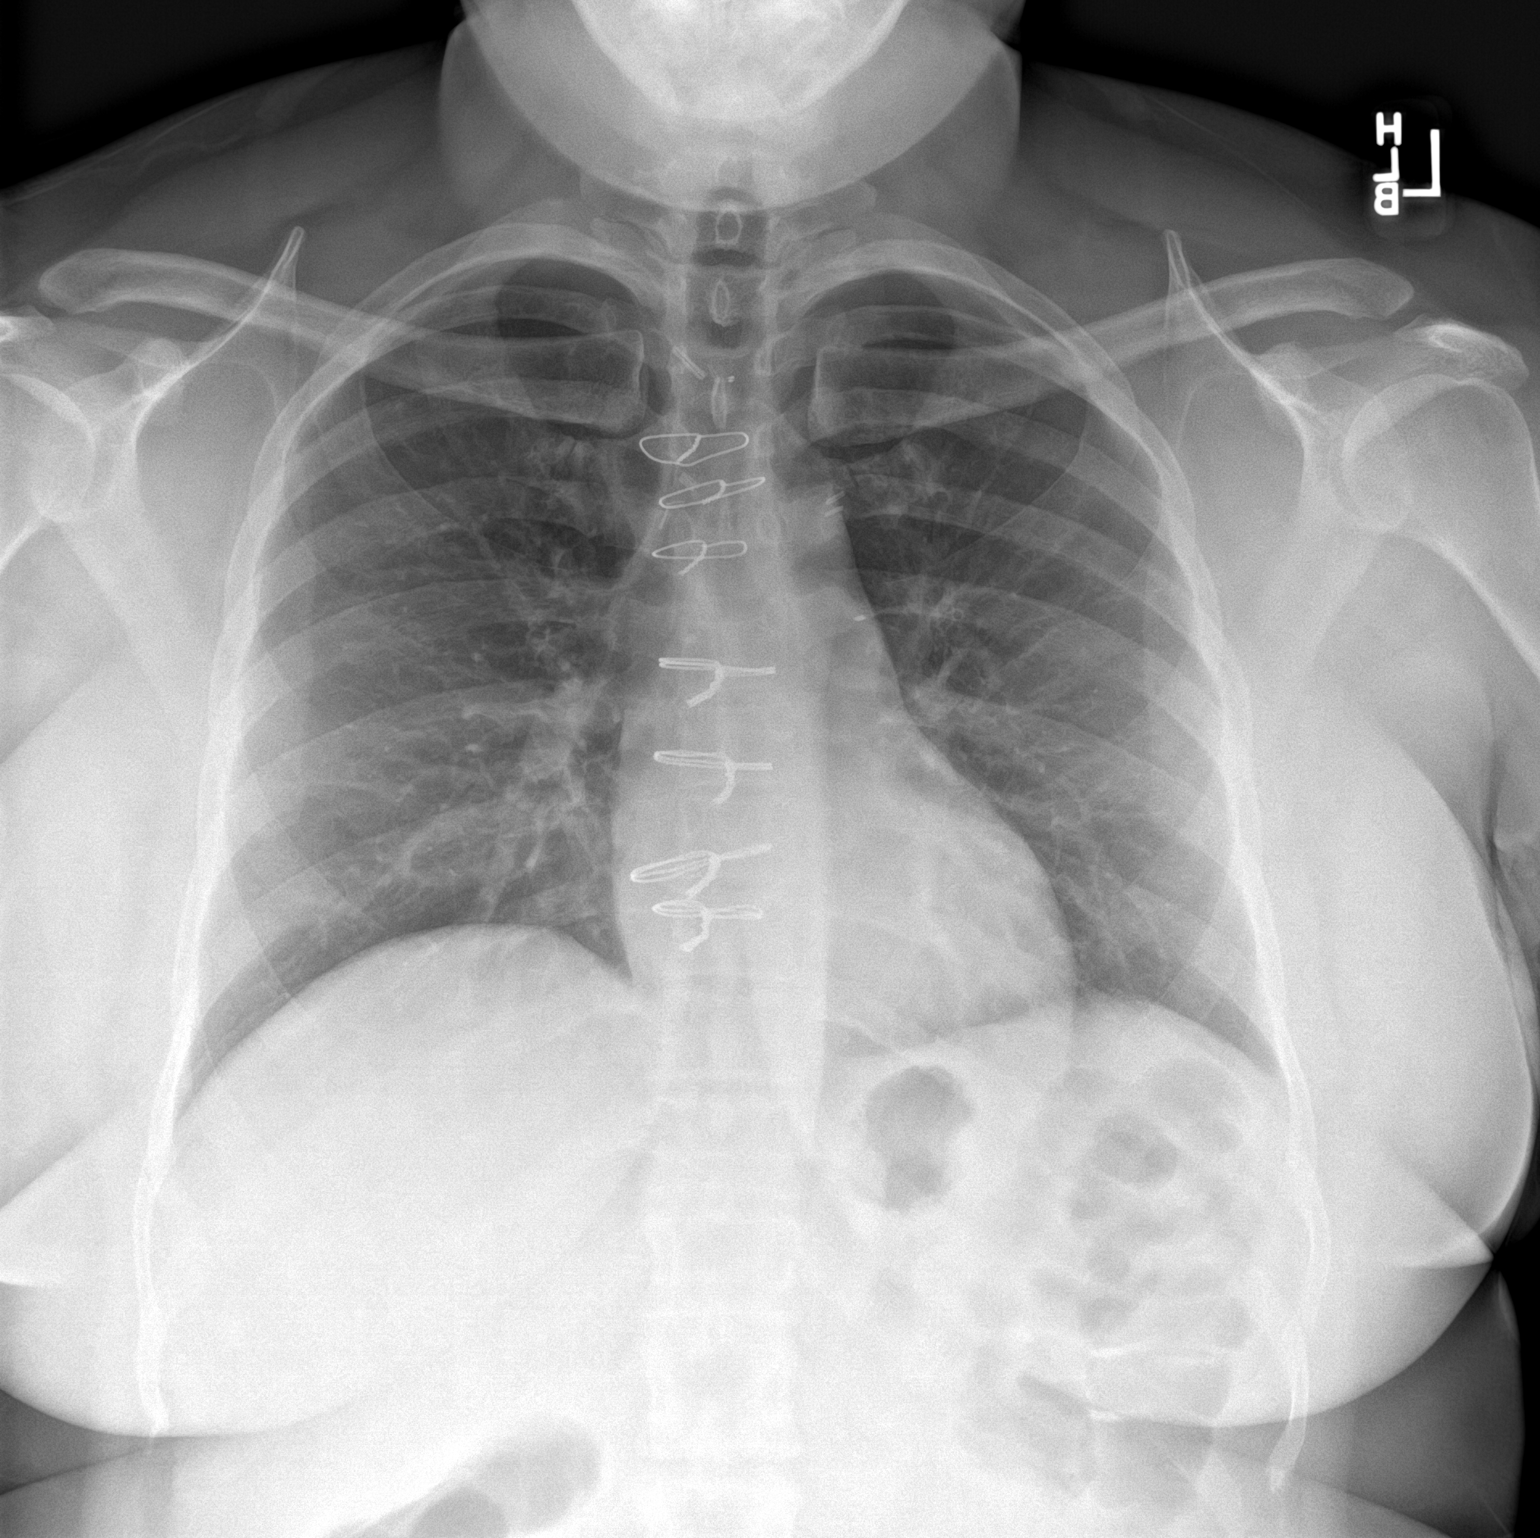

[chest lat]
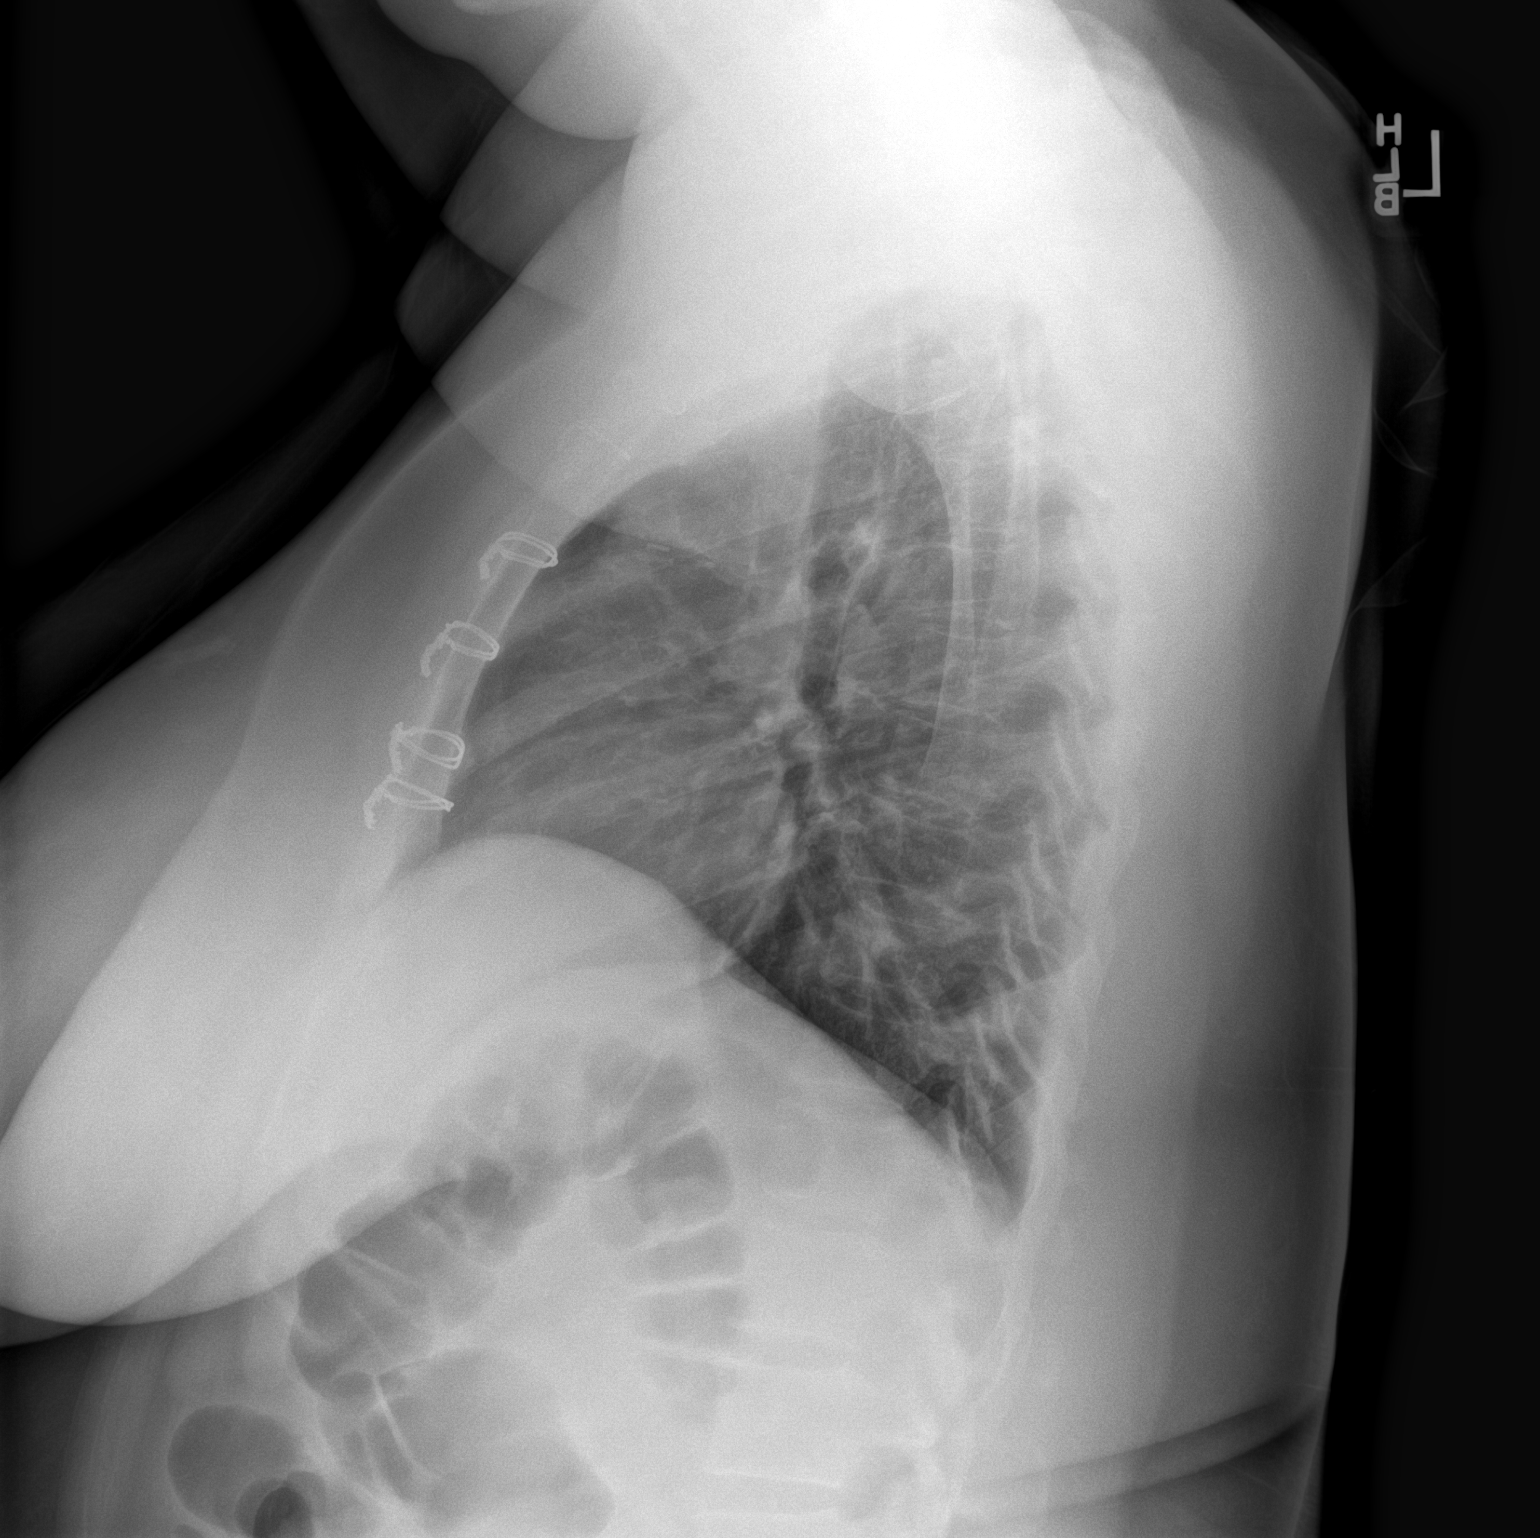

[2 of 2 positions shown; findings below may reference images not displayed]

FINDINGS: The heart size and mediastinal contours are within normal limits.
Status post median sternotomy. Both lungs are clear. The visualized
skeletal structures are unremarkable.
IMPRESSION: No acute abnormality of the lungs.

## 2023-04-19 DIAGNOSIS — I519 Heart disease, unspecified: Secondary | ICD-10-CM | POA: Diagnosis not present

## 2023-04-19 DIAGNOSIS — G4733 Obstructive sleep apnea (adult) (pediatric): Secondary | ICD-10-CM | POA: Diagnosis not present

## 2023-04-19 DIAGNOSIS — R7303 Prediabetes: Secondary | ICD-10-CM | POA: Diagnosis not present

## 2023-04-25 DIAGNOSIS — Z03818 Encounter for observation for suspected exposure to other biological agents ruled out: Secondary | ICD-10-CM | POA: Diagnosis not present

## 2023-04-25 DIAGNOSIS — J101 Influenza due to other identified influenza virus with other respiratory manifestations: Secondary | ICD-10-CM | POA: Diagnosis not present

## 2023-04-25 DIAGNOSIS — Z8669 Personal history of other diseases of the nervous system and sense organs: Secondary | ICD-10-CM | POA: Diagnosis not present

## 2023-06-10 ENCOUNTER — Other Ambulatory Visit: Payer: Self-pay | Admitting: Physician Assistant

## 2023-07-20 ENCOUNTER — Other Ambulatory Visit: Payer: Self-pay | Admitting: Physician Assistant

## 2023-07-25 ENCOUNTER — Other Ambulatory Visit: Payer: Self-pay | Admitting: Physician Assistant

## 2023-07-25 NOTE — Telephone Encounter (Signed)
 Copied from CRM [id number]. Topic: Clinical - Medication Refill >> Jul 25, 2023 12:46 PM Danae Duncans wrote: Most Recent Primary Care Visit:  Provider: Alexander Iba  Department: [facility]  Visit Type: OFFICE VISIT  Date: 05/29/2022  Medication: albuterol (VENTOLIN HFA) 108 (90 Base) MCG/ACT inhaler  Has the patient contacted their pharmacy? No (Agent: If no, request that the patient contact the pharmacy for the refill. If patient does not wish to contact the pharmacy document the reason why and proceed with request.) (Agent: If yes, when and what did the pharmacy advise?)  Is this the correct pharmacy for this prescription? Yes If no, delete pharmacy and type the correct one.  This is the patient's preferred pharmacy:  [id number] - [place], [place] - [address] AT [facility] OF [address] [place] [address] Phone: [phone] Fax: [phone]   Has the prescription been filled recently? No  Is the patient out of the medication? Yes  Has the patient been seen for an appointment in the last year OR does the patient have an upcoming appointment? Yes  Can we respond through MyChart? Yes  Agent: Please be advised that Rx refills may take up to 3 business days. We ask that you follow-up with your pharmacy.

## 2023-07-27 ENCOUNTER — Other Ambulatory Visit: Payer: Self-pay | Admitting: Physician Assistant

## 2023-08-20 ENCOUNTER — Other Ambulatory Visit: Payer: Self-pay | Admitting: Family Medicine

## 2023-08-20 DIAGNOSIS — Z79899 Other long term (current) drug therapy: Secondary | ICD-10-CM

## 2023-08-20 DIAGNOSIS — R7303 Prediabetes: Secondary | ICD-10-CM

## 2023-09-01 ENCOUNTER — Other Ambulatory Visit: Payer: Self-pay | Admitting: Physician Assistant

## 2023-09-01 DIAGNOSIS — J4520 Mild intermittent asthma, uncomplicated: Secondary | ICD-10-CM

## 2023-09-25 ENCOUNTER — Other Ambulatory Visit: Payer: Self-pay | Admitting: Physician Assistant

## 2023-09-25 DIAGNOSIS — J4520 Mild intermittent asthma, uncomplicated: Secondary | ICD-10-CM

## 2023-10-08 ENCOUNTER — Other Ambulatory Visit: Payer: Self-pay | Admitting: Physician Assistant

## 2023-11-06 ENCOUNTER — Other Ambulatory Visit: Payer: Self-pay | Admitting: Physician Assistant

## 2023-11-06 DIAGNOSIS — J4520 Mild intermittent asthma, uncomplicated: Secondary | ICD-10-CM

## 2023-11-08 ENCOUNTER — Other Ambulatory Visit (HOSPITAL_COMMUNITY): Payer: Self-pay

## 2023-11-11 ENCOUNTER — Other Ambulatory Visit (HOSPITAL_COMMUNITY): Payer: Self-pay

## 2023-11-11 MED ORDER — MOUNJARO 2.5 MG/0.5ML ~~LOC~~ SOAJ
2.5000 mg | SUBCUTANEOUS | 0 refills | Status: DC
  Filled 2023-11-11: qty 2, 28d supply, fill #0

## 2023-11-15 ENCOUNTER — Other Ambulatory Visit (HOSPITAL_COMMUNITY): Payer: Self-pay

## 2024-01-05 ENCOUNTER — Encounter: Payer: Self-pay | Admitting: Physician Assistant

## 2024-01-05 ENCOUNTER — Other Ambulatory Visit: Payer: Self-pay | Admitting: Physician Assistant

## 2024-01-06 ENCOUNTER — Ambulatory Visit (INDEPENDENT_AMBULATORY_CARE_PROVIDER_SITE_OTHER): Admitting: Physician Assistant

## 2024-01-06 VITALS — BP 160/100 | HR 89 | Temp 98.2°F | Ht 68.0 in | Wt 274.2 lb

## 2024-01-06 DIAGNOSIS — I10 Essential (primary) hypertension: Secondary | ICD-10-CM

## 2024-01-06 DIAGNOSIS — E66813 Obesity, class 3: Secondary | ICD-10-CM

## 2024-01-06 DIAGNOSIS — Z6841 Body Mass Index (BMI) 40.0 and over, adult: Secondary | ICD-10-CM | POA: Diagnosis not present

## 2024-01-06 DIAGNOSIS — J4520 Mild intermittent asthma, uncomplicated: Secondary | ICD-10-CM

## 2024-01-06 MED ORDER — MONTELUKAST SODIUM 10 MG PO TABS: ORAL_TABLET | ORAL | 3 refills | Status: AC

## 2024-01-06 MED ORDER — ALBUTEROL SULFATE HFA 108 (90 BASE) MCG/ACT IN AERS: 2.0000 | INHALATION_SPRAY | Freq: Four times a day (QID) | RESPIRATORY_TRACT | 5 refills | Status: AC | PRN

## 2024-01-06 MED ORDER — ALBUTEROL SULFATE HFA 108 (90 BASE) MCG/ACT IN AERS: 2.0000 | INHALATION_SPRAY | Freq: Four times a day (QID) | RESPIRATORY_TRACT | 0 refills | Status: DC | PRN

## 2024-01-06 MED ORDER — ALBUTEROL SULFATE 1.25 MG/3ML IN NEBU: 1.0000 | INHALATION_SOLUTION | Freq: Four times a day (QID) | RESPIRATORY_TRACT | 5 refills | Status: AC | PRN

## 2024-01-06 NOTE — Progress Notes (Signed)
 Sandy Baker is a 38 y.o. female here for a follow up of a pre-existing problem.  History of Present Illness:   Chief Complaint  Patient presents with   Medication Refill    Pt needs refill for     Discussed the use of AI scribe software for clinical note transcription with the patient, who gave verbal consent to proceed.  History of Present Illness   Sandy Baker is a 38 year old female here for obesity, hypertension and asthma follow up.   She manages her hypertension with Jardiance 10 mg, hydralazine 25 mg twice daily, and spironolactone 25 mg. Her blood pressure is generally stable, with occasional peaks causing headaches. She tolerates Jardiance well without frequent urinary tract infections or vaginal yeast infections.  She is experiencing weight gain and works with a healthy weight specialist. She previously lost weight when her medication was covered by insurance. She has sleep apnea, with a sleep study showing 104 apneic episodes per hour, and is appealing for Zepbound  coverage with her pulmonology group.  She has asthma that worsens with respiratory infections and uses Singulair , Xyzal , and olopatadine for management. She is taking albuterol  nebulizer and albuterol  inhaler as needed. Overall doing well. Considering transitioning to pulmonary.      Past Medical History:  Diagnosis Date   Acute respiratory failure (HCC)    With hypoxia and hypercapnea   Allergic rhinitis    Allergy    Asthma    Autoimmune disease    B12 deficiency    Chiari I malformation (HCC)    Congestive heart failure (HCC)    Depression    Fibromyalgia    GERD (gastroesophageal reflux disease)    Heartburn    Hypertension    Pulmonary hypertension   Migraines    Multiple food allergies    Myasthenia gravis (HCC)    Prediabetes    Pulmonary hypertension (HCC)    Right ventricular dysfunction    Sleep apnea    TMJ (dislocation of temporomandibular joint)      Social History    Tobacco Use   Smoking status: Never   Smokeless tobacco: Never  Vaping Use   Vaping status: Never Used  Substance Use Topics   Alcohol use: No   Drug use: No    Past Surgical History:  Procedure Laterality Date   CARDIAC CATHETERIZATION     05-2017   THYMECTOMY  2009   ULNAR COLLATERAL LIGAMENT REPAIR Left 12/05/2020    Family History  Problem Relation Age of Onset   Hypertension Mother    Depression Mother    Asthma Mother    Bipolar disorder Mother    Thyroid  disease Mother    Anxiety disorder Mother    Sleep apnea Mother    Obesity Father    High blood pressure Father    Aortic aneurysm Father    Asthma Sister    Tremor Sister        essential   Glaucoma Maternal Grandmother    Heart attack Paternal Grandmother    Hypertension Paternal Grandmother    Heart failure Paternal Grandfather    Breast cancer Cousin     Allergies  Allergen Reactions   Aminoglycosides Other (See Comments)    Myasthenia Gravis alert Myasthenia Gravis alert    Amoxicillin Swelling   Beta Adrenergic Blockers Other (See Comments)    Myasthenia Gravis alert  Myasthenia Gravis alert, Myasthenia Gravis alert   Bioflavonoids Hives   Macrolides And Ketolides Other (See  Comments)    Myasthenia Gravis alert Myasthenia Gravis alert    Magnesium Anaphylaxis and Other (See Comments)    Myasthenia Gravis alert Myasthenia Gravis alert    Mometasone Furo-Formoterol Fum Swelling    Patient tongue swells Patient tongue swells    Onabotulinumtoxina Other (See Comments)    Myasthenia Gravis alert Myasthenia Gravis alert    Orange Oil Anaphylaxis    Oranges/ orange juice 10/2020 led to anaphylactic reaction   Penicillamine Other (See Comments)    Myasthenia Gravis alert   Penicillins Swelling   Povidone-Iodine Hives   Procainamide Other (See Comments)    Myasthenia Gravis alert Myasthenia Gravis alert    Quinolones Other (See Comments)    Myasthenia Gravis alert Myasthenia Gravis  alert    Relpax [Eletriptan] Swelling   Sumatriptan Anaphylaxis    Other Reaction: throat closed   Tubocurarine Other (See Comments)    Myasthenia Gravis alert Myasthenia Gravis alert    Botox [Botulinum Toxin Type A]    Doxycycline     Other reaction(s): Vomiting   Iodine Hives   Latex Hives   Metformin  And Related    Quinine Derivatives Swelling   Saline     Metal sensation in mouth   Sodium Chloride    Tetracyclines & Related     Other reaction(s): Vomiting   Zithromax [Azithromycin] Swelling    Current Medications:   Current Outpatient Medications:    albuterol  (ACCUNEB ) 1.25 MG/3ML nebulizer solution, USE 1 VIAL VIA NEBULIZER EVERY 6 HOURS AS NEEDED FOR WHEEZING, Disp: 75 mL, Rfl: 0   albuterol  (VENTOLIN  HFA) 108 (90 Base) MCG/ACT inhaler, Inhale 2 puffs into the lungs every 6 (six) hours as needed for wheezing or shortness of breath., Disp: 6.7 g, Rfl: 0   EPINEPHrine  0.3 mg/0.3 mL IJ SOAJ injection, INJECT 0.3 MG IN THE MUSCLE AS NEEDED FOR ANAPHYLAXIS, Disp: 2 each, Rfl: 0   hydrALAZINE (APRESOLINE) 25 MG tablet, Take 25 mg by mouth 2 (two) times daily., Disp: , Rfl:    ibuprofen (ADVIL,MOTRIN) 800 MG tablet, , Disp: , Rfl:    JARDIANCE 10 MG TABS tablet, Take 10 mg by mouth daily., Disp: , Rfl:    levocetirizine (XYZAL ) 5 MG tablet, TAKE 1 TABLET(5 MG) BY MOUTH EVERY EVENING, Disp: 90 tablet, Rfl: 3   lidocaine  (LIDODERM ) 5 %, Place 1 patch onto the skin daily. Remove & Discard patch within 12 hours or as directed by MD, Disp: 30 patch, Rfl: 0   meloxicam  (MOBIC ) 15 MG tablet, Take 1 tablet (15 mg total) by mouth daily as needed for pain. Take with food. Do not take with other NSAIDs., Disp: 90 tablet, Rfl: 0   montelukast  (SINGULAIR ) 10 MG tablet, TAKE 1 TABLET(10 MG) BY MOUTH AT BEDTIME, Disp: 30 tablet, Rfl: 0   mycophenolate (CELLCEPT) 250 MG capsule, Twice daily, Disp: , Rfl:    mycophenolate (CELLCEPT) 500 MG tablet, Take by mouth. 2 twice daily, Disp: , Rfl:     ondansetron  (ZOFRAN -ODT) 4 MG disintegrating tablet, Take 1 tablet (4 mg total) by mouth every 8 (eight) hours as needed for nausea or vomiting., Disp: 20 tablet, Rfl: 0   riTUXimab -abbs (TRUXIMA  IV), Inject into the vein., Disp: , Rfl:    spironolactone (ALDACTONE) 25 MG tablet, Take 25 mg by mouth daily., Disp: , Rfl:    zonisamide  (ZONEGRAN ) 100 MG capsule, Take 1 capsule (100 mg total) by mouth daily. (Patient taking differently: Take 200 mg by mouth daily.), Disp: , Rfl:  Review of Systems:   Negative unless otherwise specified per HPI.  Vitals:   Vitals:   01/06/24 1359  BP: (!) 160/100  Pulse: 89  Temp: 98.2 F (36.8 C)  TempSrc: Temporal  SpO2: 99%  Weight: 274 lb 4 oz (124.4 kg)  Height: 5' 8 (1.727 m)     Body mass index is 41.7 kg/m.  Physical Exam:   Physical Exam Vitals and nursing note reviewed.  Constitutional:      General: She is not in acute distress.    Appearance: She is well-developed. She is not ill-appearing or toxic-appearing.  Cardiovascular:     Rate and Rhythm: Normal rate and regular rhythm.     Pulses: Normal pulses.     Heart sounds: Normal heart sounds, S1 normal and S2 normal.  Pulmonary:     Effort: Pulmonary effort is normal.     Breath sounds: Normal breath sounds.  Skin:    General: Skin is warm and dry.  Neurological:     Mental Status: She is alert.     GCS: GCS eye subscore is 4. GCS verbal subscore is 5. GCS motor subscore is 6.  Psychiatric:        Speech: Speech normal.        Behavior: Behavior normal. Behavior is cooperative.     Assessment and Plan:   Assessment and Plan    Essential Hypertension Blood pressure generally normal with occasional peaks. No adverse effects from Jardiance. Discussed potential side effects and advised monitoring. - Jardiance 10 mg, hydralazine 25 mg twice daily, and spironolactone 25 mg. - Monitor blood pressure regularly. - Consider reducing meloxicam  dose or taking it every other  day.  Mild, intermittent Asthma without complication Asthma with flares during respiratory infections. No current exacerbations. Discussed potential need for pulmonologist. - Consider referral to a pulmonologist if asthma flares increase.  Obesity Managed with healthy weight specialist Sandy Baker. Previous weight loss achieved. Current efforts include using [employer] app. Plans to transfer care to Sandy Baker at [facility]. Potential insurance changes with job transition. - Transfer care to Sandy Baker at [employer]. - Continue using [employer] app to increase physical activity. - continue efforts to pursue Zepbound  via pulmonary group for obstructive sleep apnea       Sandy Buttner, [name]

## 2024-01-27 ENCOUNTER — Encounter: Payer: Self-pay | Admitting: Obstetrics and Gynecology

## 2024-01-27 ENCOUNTER — Other Ambulatory Visit (HOSPITAL_COMMUNITY)
Admission: RE | Admit: 2024-01-27 | Discharge: 2024-01-27 | Disposition: A | Discharge status: Home / Self Care | Source: Ambulatory Visit | Attending: Obstetrics and Gynecology | Admitting: Obstetrics and Gynecology

## 2024-01-27 ENCOUNTER — Ambulatory Visit (INDEPENDENT_AMBULATORY_CARE_PROVIDER_SITE_OTHER): Payer: BC Managed Care – PPO | Admitting: Obstetrics and Gynecology

## 2024-01-27 VITALS — BP 118/72 | HR 86 | Temp 98.2°F | Ht 68.0 in | Wt 273.0 lb

## 2024-01-27 DIAGNOSIS — N841 Polyp of cervix uteri: Secondary | ICD-10-CM | POA: Diagnosis not present

## 2024-01-27 DIAGNOSIS — Z124 Encounter for screening for malignant neoplasm of cervix: Secondary | ICD-10-CM | POA: Diagnosis present

## 2024-01-27 DIAGNOSIS — Z1331 Encounter for screening for depression: Secondary | ICD-10-CM

## 2024-01-27 DIAGNOSIS — Z01419 Encounter for gynecological examination (general) (routine) without abnormal findings: Secondary | ICD-10-CM | POA: Diagnosis not present

## 2024-01-27 NOTE — Progress Notes (Signed)
 38 y.o. G0P0 female with myasthenia graves, pulmonary hypertension, FBMG, morbid obesity, prediabetes, multiple allergies here for annual exam. Single.  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  Patient's last menstrual period was 01/05/2024 (exact date). Period Duration (Days): 4 Period Pattern: Regular Menstrual Flow: Light Menstrual Control: Maxi pad, Panty liner, Tampon Dysmenorrhea: None  Abnormal bleeding: no Pelvic discharge or pain: no Breast mass, nipple discharge or skin changes : no Birth control: none  Gardasil: complete Last PAP:     Component Value Date/Time   DIAGPAP  01/17/2022 0907    - Negative for intraepithelial lesion or malignancy (NILM)   DIAGPAP  01/04/2021 0957    - Negative for intraepithelial lesion or malignancy (NILM)   ADEQPAP  01/17/2022 0907    Satisfactory for evaluation; transformation zone component PRESENT.   ADEQPAP  01/04/2021 0957    Satisfactory for evaluation; transformation zone component PRESENT.   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 increased risk of malignancy  OB History  Gravida Para Term Preterm AB Living  0       SAB IAB Ectopic Multiple Live Births          Past Medical History:  Diagnosis Date   Acute respiratory failure (HCC)    With hypoxia and hypercapnea   Allergic rhinitis    Allergy    Asthma    Autoimmune disease    B12 deficiency    Chiari I malformation (HCC)    Congestive heart failure  (HCC)    Depression    Fibromyalgia    GERD (gastroesophageal reflux disease)    Heartburn    Hypertension    Pulmonary hypertension   Migraines    Multiple food allergies    Myasthenia gravis (HCC)    Prediabetes    Pulmonary hypertension (HCC)    Right ventricular dysfunction    Sleep apnea    TMJ (dislocation of temporomandibular joint)     Past Surgical History:  Procedure Laterality Date   CARDIAC CATHETERIZATION     05-2017   THYMECTOMY  2009   ULNAR COLLATERAL LIGAMENT REPAIR Left 12/05/2020    Current Outpatient Medications on File Prior to Visit  Medication Sig Dispense Refill   albuterol  (ACCUNEB ) 1.25 MG/3ML nebulizer solution Take 3 mLs (1.25 mg total) by nebulization every 6 (six) hours as needed for wheezing. 75 mL 5   albuterol  (VENTOLIN  HFA) 108 (90 Base) MCG/ACT inhaler Inhale 2 puffs into the lungs every 6 (six) hours as needed for wheezing or shortness of breath. 6.7 g 5   EPINEPHrine  0.3 mg/0.3 mL IJ SOAJ injection INJECT 0.3 MG IN THE MUSCLE AS NEEDED FOR ANAPHYLAXIS 2 each 0   hydrALAZINE (APRESOLINE) 25 MG tablet Take 25 mg by mouth 2 (two) times daily.     ibuprofen (ADVIL,MOTRIN) 800 MG tablet      JARDIANCE 10 MG TABS tablet  Take 10 mg by mouth daily.     levocetirizine (XYZAL ) 5 MG tablet TAKE 1 TABLET(5 MG) BY MOUTH EVERY EVENING 90 tablet 3   meloxicam  (MOBIC ) 15 MG tablet Take 1 tablet (15 mg total) by mouth daily as needed for pain. Take with food. Do not take with other NSAIDs. 90 tablet 0   montelukast  (SINGULAIR ) 10 MG tablet TAKE 1 TABLET(10 MG) BY MOUTH AT BEDTIME 90 tablet 3   mycophenolate (CELLCEPT) 250 MG capsule Twice daily     mycophenolate (CELLCEPT) 500 MG tablet Take by mouth. 2 twice daily     ondansetron  (ZOFRAN -ODT) 4 MG disintegrating tablet Take 1 tablet (4 mg total) by mouth every 8 (eight) hours as needed for nausea or vomiting. 20 tablet 0   riTUXimab -abbs (TRUXIMA  IV) Inject into the vein.     spironolactone (ALDACTONE) 25  MG tablet Take 25 mg by mouth daily.     zonisamide  (ZONEGRAN ) 100 MG capsule Take 1 capsule (100 mg total) by mouth daily. (Patient taking differently: Take 200 mg by mouth daily.)     No current facility-administered medications on file prior to visit.    Social History   Socioeconomic History   Marital status: Single    Spouse name: Not on file   Number of children: Not on file   Years of education: BA   Highest education level: Master's degree (e.g., MA, MS, MEng, MEd, MSW, MBA)  Occupational History   Occupation: [employer]    Occupation: [employer]   Occupation: Admin Assist  Tobacco Use   Smoking status: Never   Smokeless tobacco: Never  Vaping Use   Vaping status: Never Used  Substance and Sexual Activity   Alcohol use: No   Drug use: No   Sexual activity: Not Currently    Birth control/protection: Abstinence    Comment: 1st intercourse 38 yo-Fewer than 5 partners  Other Topics Concern   Not on file  Social History Narrative   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   No children   Single   Social Drivers of Health   Financial Resource Strain: Patient Declined (01/06/2024)   Overall Financial Resource Strain (CARDIA)    Difficulty of Paying Living Expenses: Patient declined  Food Insecurity: Patient Declined (01/06/2024)   Hunger Vital Sign    Worried About Running Out of Food in the Last Year: Patient declined    Ran Out of Food in the Last Year: Patient declined  Transportation Needs: Patient Declined (01/06/2024)   PRAPARE - Administrator, Civil Service (Medical): Patient declined    Lack of Transportation (Non-Medical): Patient declined  Physical Activity: Inactive (01/06/2024)   Exercise Vital Sign    Days of Exercise per Week: 0 days    Minutes of Exercise per Session: Not on file  Stress: No Stress Concern Present (01/06/2024)   [employer] of Occupational Health - Occupational Stress Questionnaire    Feeling of Stress:  Not at all  Social Connections: Unknown (01/06/2024)   Social Connection and Isolation Panel    Frequency of Communication with Friends and Family: Never    Frequency of Social Gatherings with Friends and Family: Never    Attends Religious Services: Never    Database administrator or Organizations: No    Attends Engineer, structural: Not on file    Marital Status: Patient declined  Intimate Partner Violence: Not on file    Family History  Problem Relation Age  of Onset   Hypertension Mother    Depression Mother    Asthma Mother    Bipolar disorder Mother    Thyroid  disease Mother    Anxiety disorder Mother    Sleep apnea Mother    Obesity Father    High blood pressure Father    Aortic aneurysm Father    Asthma Sister    Tremor Sister        essential   Glaucoma Maternal Grandmother    Heart attack Paternal Grandmother    Hypertension Paternal Grandmother    Heart failure Paternal Grandfather    Breast cancer Cousin     Allergies  Allergen Reactions   Aminoglycosides Other (See Comments)    Myasthenia Gravis alert Myasthenia Gravis alert    Amoxicillin Swelling   Beta Adrenergic Blockers Other (See Comments)    Myasthenia Gravis alert  Myasthenia Gravis alert, Myasthenia Gravis alert   Bioflavonoids Hives   Macrolides And Ketolides Other (See Comments)    Myasthenia Gravis alert Myasthenia Gravis alert    Magnesium Anaphylaxis and Other (See Comments)    Myasthenia Gravis alert Myasthenia Gravis alert    Mometasone Furo-Formoterol Fum Swelling    Patient tongue swells Patient tongue swells    Onabotulinumtoxina Other (See Comments)    Myasthenia Gravis alert Myasthenia Gravis alert    Orange Oil Anaphylaxis    Oranges/ orange juice 10/2020 led to anaphylactic reaction   Penicillamine Other (See Comments)    Myasthenia Gravis alert   Penicillins Swelling   Povidone-Iodine Hives   Procainamide Other (See Comments)    Myasthenia Gravis  alert Myasthenia Gravis alert    Quinolones Other (See Comments)    Myasthenia Gravis alert Myasthenia Gravis alert    Relpax [Eletriptan] Swelling   Sumatriptan Anaphylaxis    Other Reaction: throat closed   Tubocurarine Other (See Comments)    Myasthenia Gravis alert Myasthenia Gravis alert    Botox [Botulinum Toxin Type A]    Doxycycline     Other reaction(s): Vomiting   Iodine Hives   Latex Hives   Metformin  And Related    Quinine Derivatives Swelling   Saline     Metal sensation in mouth   Sodium Chloride    Tetracyclines & Related     Other reaction(s): Vomiting   Zithromax [Azithromycin] Swelling      PE Today's Vitals   01/27/24 0835  BP: 118/72  Pulse: 86  Temp: 98.2 F (36.8 C)  TempSrc: Oral  SpO2: 98%  Weight: 273 lb (123.8 kg)  Height: 5' 8 (1.727 m)   Body mass index is 41.51 kg/m.  Physical Exam Vitals reviewed. Exam conducted with a chaperone present.  Constitutional:      General: She is not in acute distress.    Appearance: Normal appearance.  HENT:     Head: Normocephalic and atraumatic.     Nose: Nose normal.  Eyes:     Extraocular Movements: Extraocular movements intact.     Conjunctiva/sclera: Conjunctivae normal.  Neck:     Thyroid : No thyroid  mass, thyromegaly or thyroid  tenderness.  Pulmonary:     Effort: Pulmonary effort is normal.  Chest:     Chest wall: No mass or tenderness.  Breasts:    Right: Normal. No swelling, mass, nipple discharge, skin change or tenderness.     Left: Normal. No swelling, mass, nipple discharge, skin change or tenderness.    Abdominal:     General: There is no distension.  Palpations: Abdomen is soft.     Tenderness: There is no abdominal tenderness.  Genitourinary:    General: Normal vulva.     Exam position: Lithotomy position.     Urethra: No prolapse.     Vagina: Normal. No vaginal discharge or bleeding.     Cervix: Normal. No lesion.     Uterus: Normal. Not enlarged and not  tender.      Adnexa: Right adnexa normal and left adnexa normal.        Comments: Polyp of cervix Musculoskeletal:        General: Normal range of motion.     Cervical back: Normal range of motion.  Lymphadenopathy:     Upper Body:     Right upper body: No axillary adenopathy.     Left upper body: No axillary adenopathy.     Lower Body: No right inguinal adenopathy. No left inguinal adenopathy.  Skin:    General: Skin is warm and dry.  Neurological:     General: No focal deficit present.     Mental Status: She is alert.  Psychiatric:        Mood and Affect: Mood normal.        Behavior: Behavior normal.      Assessment and Plan:        Well woman exam with routine gynecological exam Assessment & Plan: Cervical cancer screening performed according to ASCCP guidelines. Labs and immunizations with her primary Encouraged safe sexual practices as indicated Encouraged healthy lifestyle practices with diet and exercise Discussed mole of left buttocks Known fibroadenoma of breasts, documented additional small round masses of right breast c/w fibroadenoma  Negative depression screening  Cervical cancer screening -     Cytology - PAP  Cervical polyp -     Cytology - PAP - Biopsy; future  RTO for cervical biopsy.  Sandy LULLA Pa, MD

## 2024-01-27 NOTE — Patient Instructions (Signed)

## 2024-01-27 NOTE — Assessment & Plan Note (Signed)
Cervical cancer screening performed according to ASCCP guidelines. Labs and immunizations with her primary Encouraged safe sexual practices as indicated. Encouraged healthy lifestyle practices with diet and exercise

## 2024-01-31 ENCOUNTER — Ambulatory Visit: Payer: Self-pay | Admitting: Obstetrics and Gynecology

## 2024-01-31 LAB — CYTOLOGY - PAP
Comment: NEGATIVE
Diagnosis: UNDETERMINED — AB
High risk HPV: NEGATIVE

## 2024-02-14 ENCOUNTER — Telehealth: Payer: Self-pay

## 2024-02-14 ENCOUNTER — Other Ambulatory Visit (HOSPITAL_COMMUNITY): Payer: Self-pay

## 2024-02-14 ENCOUNTER — Encounter: Payer: Self-pay | Admitting: Physician Assistant

## 2024-02-14 ENCOUNTER — Other Ambulatory Visit: Payer: Self-pay | Admitting: Physician Assistant

## 2024-02-14 ENCOUNTER — Telehealth: Payer: Self-pay | Admitting: *Deleted

## 2024-02-14 MED ORDER — ZEPBOUND 2.5 MG/0.5ML ~~LOC~~ SOAJ: 2.5000 mg | SUBCUTANEOUS | 1 refills | Status: AC

## 2024-02-14 NOTE — Telephone Encounter (Signed)
 Please do PA for Zepbound  2.5 mg for Sleep Apnea.

## 2024-02-14 NOTE — Telephone Encounter (Signed)
 Pharmacy Patient Advocate Encounter   Received notification from Physician's Office that prior authorization for Zepbound  2.5MG /0.5ML Auto-injectors is required/requested.   Insurance verification completed.   The patient is insured through [employer].   Per test claim: Drug not covered.

## 2024-02-16 ENCOUNTER — Encounter: Payer: Self-pay | Admitting: Obstetrics and Gynecology

## 2024-02-17 ENCOUNTER — Ambulatory Visit: Admitting: Obstetrics and Gynecology

## 2024-02-17 ENCOUNTER — Other Ambulatory Visit (HOSPITAL_COMMUNITY)
Admission: RE | Admit: 2024-02-17 | Discharge: 2024-02-17 | Disposition: A | Discharge status: Home / Self Care | Source: Ambulatory Visit | Attending: Obstetrics and Gynecology | Admitting: Obstetrics and Gynecology

## 2024-02-17 VITALS — BP 118/82 | HR 89 | Temp 98.4°F | Wt 279.0 lb

## 2024-02-17 DIAGNOSIS — N841 Polyp of cervix uteri: Secondary | ICD-10-CM | POA: Insufficient documentation

## 2024-02-17 MED ORDER — LIDOCAINE HCL (PF) 1 % IJ SOLN: 3.0000 mL | Freq: Once | INTRAMUSCULAR | Status: AC

## 2024-02-17 NOTE — Progress Notes (Signed)
 38 y.o. G0P0 female with known breast fibroadenomas, cervical polyp, myasthenia graves, pulmonary hypertension, FBMG, morbid obesity, prediabetes, multiple allergies here for procedure. Single  No LMP recorded.   Birth control: Abstinent since 2013 Last PAP:    Component Value Date/Time   DIAGPAP (A) 01/27/2024 0901    - Atypical squamous cells of undetermined significance (ASC-US )   DIAGPAP  01/17/2022 0907    - Negative for intraepithelial lesion or malignancy (NILM)   DIAGPAP  01/04/2021 0957    - Negative for intraepithelial lesion or malignancy (NILM)   HPVHIGH Negative 01/27/2024 0901   ADEQPAP  01/27/2024 0901    Satisfactory for evaluation; transformation zone component PRESENT.   ADEQPAP  01/17/2022 0907    Satisfactory for evaluation; transformation zone component PRESENT.   ADEQPAP  01/04/2021 0957    Satisfactory for evaluation; transformation zone component PRESENT.   GYN HISTORY: Left breast biopsy, 2021: fibroadenoma, PASH- no increased risk of malignancy   OB History  Gravida Para Term Preterm AB Living  0       SAB IAB Ectopic Multiple Live Births          Past Medical History:  Diagnosis Date   Acute respiratory failure (HCC)    With hypoxia and hypercapnea   Allergic rhinitis    Allergy    Asthma    Autoimmune disease    B12 deficiency    Chiari I malformation (HCC)    Congestive heart failure (HCC)    Depression    Fibromyalgia    GERD (gastroesophageal reflux disease)    Heartburn    Hypertension    Pulmonary hypertension   Migraines    Multiple food allergies    Myasthenia gravis (HCC)    Prediabetes    Pulmonary hypertension (HCC)    Right ventricular dysfunction    Sleep apnea    TMJ (dislocation of temporomandibular joint)     Past Surgical History:  Procedure Laterality Date   CARDIAC CATHETERIZATION     05-2017   THYMECTOMY  2009   ULNAR COLLATERAL LIGAMENT REPAIR Left 12/05/2020    Current Outpatient Medications on File  Prior to Visit  Medication Sig Dispense Refill   albuterol  (ACCUNEB ) 1.25 MG/3ML nebulizer solution Take 3 mLs (1.25 mg total) by nebulization every 6 (six) hours as needed for wheezing. 75 mL 5   albuterol  (VENTOLIN  HFA) 108 (90 Base) MCG/ACT inhaler Inhale 2 puffs into the lungs every 6 (six) hours as needed for wheezing or shortness of breath. 6.7 g 5   EPINEPHrine  0.3 mg/0.3 mL IJ SOAJ injection INJECT 0.3 MG IN THE MUSCLE AS NEEDED FOR ANAPHYLAXIS 2 each 0   hydrALAZINE (APRESOLINE) 25 MG tablet Take 25 mg by mouth 2 (two) times daily.     ibuprofen (ADVIL,MOTRIN) 800 MG tablet      JARDIANCE 10 MG TABS tablet Take 10 mg by mouth daily.     levocetirizine (XYZAL ) 5 MG tablet TAKE 1 TABLET(5 MG) BY MOUTH EVERY EVENING 90 tablet 3   meloxicam  (MOBIC ) 15 MG tablet Take 1 tablet (15 mg total) by mouth daily as needed for pain. Take with food. Do not take with other NSAIDs. 90 tablet 0   methocarbamol (ROBAXIN) 500 MG tablet Take 1 tablet 3 times a day by oral route as needed for 5 days.     montelukast  (SINGULAIR ) 10 MG tablet TAKE 1 TABLET(10 MG) BY MOUTH AT BEDTIME 90 tablet 3   mycophenolate (CELLCEPT) 250 MG capsule Twice daily  mycophenolate (CELLCEPT) 500 MG tablet Take by mouth. 2 twice daily     ondansetron  (ZOFRAN -ODT) 4 MG disintegrating tablet Take 1 tablet (4 mg total) by mouth every 8 (eight) hours as needed for nausea or vomiting. 20 tablet 0   riTUXimab -abbs (TRUXIMA  IV) Inject into the vein.     spironolactone (ALDACTONE) 25 MG tablet Take 25 mg by mouth daily.     tirzepatide  (ZEPBOUND ) 2.5 MG/0.5ML Pen Inject 2.5 mg into the skin once a week. 2 mL 1   zonisamide  (ZONEGRAN ) 100 MG capsule Take 1 capsule (100 mg total) by mouth daily. (Patient taking differently: Take 200 mg by mouth daily.)     No current facility-administered medications on file prior to visit.    Allergies  Allergen Reactions   Aminoglycosides Other (See Comments)    Myasthenia Gravis  alert Myasthenia Gravis alert    Amoxicillin Swelling   Beta Adrenergic Blockers Other (See Comments)    Myasthenia Gravis alert  Myasthenia Gravis alert, Myasthenia Gravis alert   Bioflavonoids Hives   Macrolides And Ketolides Other (See Comments)    Myasthenia Gravis alert Myasthenia Gravis alert    Magnesium Anaphylaxis and Other (See Comments)    Myasthenia Gravis alert Myasthenia Gravis alert    Mometasone Furo-Formoterol Fum Swelling    Patient tongue swells Patient tongue swells    Onabotulinumtoxina Other (See Comments)    Myasthenia Gravis alert Myasthenia Gravis alert    Orange Oil Anaphylaxis    Oranges/ orange juice 10/2020 led to anaphylactic reaction   Penicillamine Other (See Comments)    Myasthenia Gravis alert   Penicillins Swelling   Povidone-Iodine Hives   Procainamide Other (See Comments)    Myasthenia Gravis alert Myasthenia Gravis alert    Quinolones Other (See Comments)    Myasthenia Gravis alert Myasthenia Gravis alert    Relpax [Eletriptan] Swelling   Sumatriptan Anaphylaxis    Other Reaction: throat closed   Tubocurarine Other (See Comments)    Myasthenia Gravis alert Myasthenia Gravis alert    Botox [Botulinum Toxin Type A]    Doxycycline     Other reaction(s): Vomiting   Iodine Hives   Latex Hives   Metformin  And Related    Quinine Derivatives Swelling   Saline     Metal sensation in mouth   Sodium Chloride    Tetracyclines & Related     Other reaction(s): Vomiting   Zithromax [Azithromycin] Swelling      PE Today's Vitals   02/17/24 1004  BP: 118/82  Pulse: 89  Temp: 98.4 F (36.9 C)  TempSrc: Oral  SpO2: 98%  Weight: 279 lb (126.6 kg)   Body mass index is 42.42 kg/m.  Physical Exam Vitals reviewed. Exam conducted with a chaperone present.  Constitutional:      General: She is not in acute distress.    Appearance: Normal appearance.  HENT:     Head: Normocephalic and atraumatic.     Nose: Nose normal.   Eyes:     Extraocular Movements: Extraocular movements intact.     Conjunctiva/sclera: Conjunctivae normal.  Pulmonary:     Effort: Pulmonary effort is normal.  Genitourinary:    General: Normal vulva.     Exam position: Lithotomy position.     Vagina: Normal. No vaginal discharge.     Cervix: Lesion present. No cervical motion tenderness or discharge.        Comments: Cervical polyp Musculoskeletal:        General: Normal range of motion.  Cervical back: Normal range of motion.  Neurological:     General: No focal deficit present.     Mental Status: She is alert.  Psychiatric:        Mood and Affect: Mood normal.        Behavior: Behavior normal.     Cervical Biopsy Procedure Consented for procedure.  Time out performed. Speculum placed in vagina.  3cc 1% lidocaine  w/o epi was injected at the base of the polyp. Polyp was excised at its base using scissors.  Specimen was handed off for pathology. Silver nitrate was applied to the base of the polyp. Good hemostasis.  Minimal EBL. No complications.  Tolerated well.     Assessment and Plan:        Cervical polyp -     Biopsy cervix  Uncomplicated polypectomy, will f/u pathology  Sandy LULLA Pa, MD

## 2024-02-17 NOTE — Patient Instructions (Signed)
 It is common to have vaginal bleeding and cramping for up to 72 hours after your biopsy. Please call our office with heavy vaginal bleeding, severe abdominal pain or fever. Avoid intercourse, tampon use, douching and baths for 7 days to decrease the risk of infection.

## 2024-02-18 ENCOUNTER — Ambulatory Visit: Payer: Self-pay | Admitting: Obstetrics and Gynecology

## 2024-02-18 LAB — SURGICAL PATHOLOGY

## 2024-02-18 NOTE — Telephone Encounter (Signed)
 Please see message and advise

## 2024-02-20 ENCOUNTER — Encounter: Payer: Self-pay | Admitting: Physician Assistant

## 2024-02-21 ENCOUNTER — Telehealth: Payer: Self-pay

## 2024-02-21 ENCOUNTER — Other Ambulatory Visit (HOSPITAL_COMMUNITY): Payer: Self-pay

## 2024-02-21 NOTE — Telephone Encounter (Signed)
 Noted

## 2024-02-21 NOTE — Telephone Encounter (Signed)
 Sleep study is in Care Everywhere under Documents, date was 10/29/2014 it says Procedure visit Sleep Medicine, and looks like she had one done again 11/10/2014 Procedure visit Sleep Medicine.

## 2024-02-21 NOTE — Telephone Encounter (Signed)
 Pharmacy Patient Advocate Encounter   Received notification from Physician's Office that prior authorization for Zepbound  2.5MG /0.5ML pen-injectors is required/requested.   Insurance verification completed.   The patient is insured through [facility].   Per test claim: PA required; PA submitted to above mentioned insurance via Latent Key/confirmation #/EOC [id number] Status is pending

## 2024-02-21 NOTE — Telephone Encounter (Signed)
 Sandy Baker, please see message and letter Sandy Baker has done. Please submit an appeal for Zepbound . Thanks

## 2024-02-24 NOTE — Telephone Encounter (Signed)
 Pharmacy Patient Advocate Encounter  Received notification from [facility] that Prior Authorization for Zepbound  has been DENIED.  Full denial letter will be uploaded to the media tab. See denial reason below.   PA #/Case ID/Reference #: [id number]

## 2024-04-01 ENCOUNTER — Encounter: Admitting: Family Medicine
